# Patient Record
Sex: Male | Born: 1965 | Race: White | Hispanic: No | Marital: Married | Smoking: Light tobacco smoker
Health system: Southern US, Community
[De-identification: ages and names within clinical notes are randomized; demographics above are authoritative.]

## PROBLEM LIST (undated history)

## (undated) DIAGNOSIS — K8020 Calculus of gallbladder without cholecystitis without obstruction: Secondary | ICD-10-CM

## (undated) DIAGNOSIS — K219 Gastro-esophageal reflux disease without esophagitis: Secondary | ICD-10-CM

## (undated) DIAGNOSIS — Z9189 Other specified personal risk factors, not elsewhere classified: Secondary | ICD-10-CM

## (undated) DIAGNOSIS — A779 Spotted fever, unspecified: Secondary | ICD-10-CM

## (undated) DIAGNOSIS — K269 Duodenal ulcer, unspecified as acute or chronic, without hemorrhage or perforation: Secondary | ICD-10-CM

## (undated) HISTORY — DX: Spotted fever, unspecified: A77.9

## (undated) HISTORY — DX: Gastro-esophageal reflux disease without esophagitis: K21.9

## (undated) HISTORY — PX: NO PAST SURGERIES: SHX2092

## (undated) HISTORY — DX: Duodenal ulcer, unspecified as acute or chronic, without hemorrhage or perforation: K26.9

## (undated) HISTORY — DX: Calculus of gallbladder without cholecystitis without obstruction: K80.20

## (undated) HISTORY — DX: Other specified personal risk factors, not elsewhere classified: Z91.89

---

## 2009-06-18 ENCOUNTER — Ambulatory Visit: Payer: Self-pay | Admitting: Internal Medicine

## 2009-07-01 ENCOUNTER — Ambulatory Visit: Payer: Self-pay | Admitting: Internal Medicine

## 2009-07-06 LAB — CONVERTED CEMR LAB
BUN: 7 mg/dL (ref 6–23)
Basophils Relative: 0.5 % (ref 0.0–3.0)
CO2: 31 meq/L (ref 19–32)
Calcium: 9.1 mg/dL (ref 8.4–10.5)
Cholesterol: 210 mg/dL — ABNORMAL HIGH (ref 0–200)
Creatinine, Ser: 1 mg/dL (ref 0.4–1.5)
Eosinophils Relative: 3.4 % (ref 0.0–5.0)
GFR calc non Af Amer: 87.19 mL/min (ref >60)
Glucose, Bld: 85 mg/dL (ref 70–99)
HCT: 45.2 % (ref 39.0–52.0)
Hemoglobin: 15.7 g/dL (ref 13.0–17.0)
Lymphs Abs: 1.6 10*3/uL (ref 0.7–4.0)
MCV: 92.7 fL (ref 78.0–100.0)
Monocytes Absolute: 0.5 10*3/uL (ref 0.1–1.0)
Monocytes Relative: 8.7 % (ref 3.0–12.0)
Neutro Abs: 3.4 10*3/uL (ref 1.4–7.7)
Platelets: 228 10*3/uL (ref 150.0–400.0)
RBC: 4.87 M/uL (ref 4.22–5.81)
VLDL: 23.6 mg/dL (ref 0.0–40.0)
WBC: 5.7 10*3/uL (ref 4.5–10.5)

## 2011-04-25 ENCOUNTER — Encounter: Payer: Self-pay | Admitting: Internal Medicine

## 2011-04-25 ENCOUNTER — Ambulatory Visit (INDEPENDENT_AMBULATORY_CARE_PROVIDER_SITE_OTHER): Payer: Private Health Insurance - Indemnity | Admitting: Internal Medicine

## 2011-04-25 VITALS — BP 108/68 | HR 73 | Temp 98.2°F | Resp 14 | Wt 204.4 lb

## 2011-04-25 DIAGNOSIS — M779 Enthesopathy, unspecified: Secondary | ICD-10-CM

## 2011-04-25 DIAGNOSIS — M778 Other enthesopathies, not elsewhere classified: Secondary | ICD-10-CM

## 2011-04-25 MED ORDER — PREDNISONE 10 MG PO TABS: ORAL_TABLET | ORAL | Status: AC

## 2011-04-25 NOTE — Progress Notes (Signed)
  Subjective:    Patient ID: Todd Roman, male    DOB: 1965/08/27, 45 y.o.   MRN: [id number]  HPI  4 weeks history of pain in the right elbow, slightly swollen, does not recall any injury per se but he does play golf very frequently. Simple activities such as shaking hands or lifting causes pain and the patient is avoiding use his arm.  Past Medical History  Diagnosis Date  . No active medical problems    Past Surgical History  Procedure Date  . No past surgeries      Review of Systems No fever No redness or warmness in the area No history of previous injury there     Objective:   Physical Exam  Constitutional: He is oriented to person, place, and time. He appears well-developed and well-nourished. No distress.  Musculoskeletal:       Left elbow normal. Right elbow: Slightly tender at the external aspect of the joint, very mild swelling without fluctuance, no red or hot. Range of motion is normal, pain is reproducible with certain movements of the elbow.  Neurological: He is alert and oriented to person, place, and time.  Skin: He is not diaphoretic.  Psychiatric: He has a normal mood and affect. His behavior is normal. Judgment and thought content normal.       Assessment & Plan:

## 2011-04-25 NOTE — Assessment & Plan Note (Signed)
See instructions, if no better will call for referral

## 2011-04-25 NOTE — Patient Instructions (Signed)
Avoid elbow use x 2 weeks Ice at night x 2 weeks Arm brace Prednisone as prescribed Motrin 200 mg 2 tabs every 6 hours as needed, watch for stomach side effects, nausea , burning  Call if not improving in 2 - 3 weeks

## 2013-09-28 DIAGNOSIS — A779 Spotted fever, unspecified: Secondary | ICD-10-CM

## 2013-09-28 HISTORY — DX: Spotted fever, unspecified: A77.9

## 2014-05-13 ENCOUNTER — Ambulatory Visit (INDEPENDENT_AMBULATORY_CARE_PROVIDER_SITE_OTHER): Payer: BC Managed Care – PPO | Admitting: Internal Medicine

## 2014-05-13 ENCOUNTER — Telehealth: Payer: Self-pay | Admitting: Internal Medicine

## 2014-05-13 ENCOUNTER — Encounter: Payer: Self-pay | Admitting: Internal Medicine

## 2014-05-13 ENCOUNTER — Ambulatory Visit (HOSPITAL_BASED_OUTPATIENT_CLINIC_OR_DEPARTMENT_OTHER)
Admission: RE | Admit: 2014-05-13 | Discharge: 2014-05-13 | Disposition: A | Discharge status: Home / Self Care | Payer: BC Managed Care – PPO | Source: Ambulatory Visit | Attending: Internal Medicine | Admitting: Internal Medicine

## 2014-05-13 VITALS — BP 174/102 | HR 85 | Temp 99.2°F | Wt 225.2 lb

## 2014-05-13 DIAGNOSIS — R030 Elevated blood-pressure reading, without diagnosis of hypertension: Secondary | ICD-10-CM

## 2014-05-13 DIAGNOSIS — K219 Gastro-esophageal reflux disease without esophagitis: Secondary | ICD-10-CM | POA: Insufficient documentation

## 2014-05-13 DIAGNOSIS — R079 Chest pain, unspecified: Secondary | ICD-10-CM | POA: Diagnosis present

## 2014-05-13 DIAGNOSIS — R5383 Other fatigue: Secondary | ICD-10-CM

## 2014-05-13 DIAGNOSIS — R0789 Other chest pain: Secondary | ICD-10-CM

## 2014-05-13 DIAGNOSIS — K2100 Gastro-esophageal reflux disease with esophagitis, without bleeding: Secondary | ICD-10-CM

## 2014-05-13 DIAGNOSIS — R42 Dizziness and giddiness: Secondary | ICD-10-CM

## 2014-05-13 DIAGNOSIS — IMO0001 Reserved for inherently not codable concepts without codable children: Secondary | ICD-10-CM

## 2014-05-13 MED ORDER — RANITIDINE HCL 150 MG PO TABS: 150.0000 mg | ORAL_TABLET | ORAL | Status: DC | PRN

## 2014-05-13 MED ORDER — OMEPRAZOLE 40 MG PO CPDR: 40.0000 mg | DELAYED_RELEASE_CAPSULE | Freq: Every day | ORAL | Status: DC

## 2014-05-13 NOTE — Patient Instructions (Signed)
Get your blood work before you leave   Stop by the first floor and get the XR   Omeprazole 20 mg one before breakfast every day  Check the  blood pressure daily   Be sure your blood pressure is between  145/85 and 110/65.  if it is consistently higher or lower, let me know  Please come back to the office in 10 days  for a routine check up    Call anytime if he has any chest pain, difficulty breathing, severe dizziness, headache  Moderate alcohol

## 2014-05-13 NOTE — Progress Notes (Signed)
Pre visit review using our clinic review tool, if applicable. No additional management support is needed unless otherwise documented below in the visit note. 

## 2014-05-13 NOTE — Progress Notes (Signed)
Subjective:    Patient ID: Todd Roman, male    DOB: [birth year], 48 y.o.   MRN: [id number]  DOS:  05/13/2014 Type of visit - description : acute visit, new pt, several sx  Interval history: Started feeling unwell 5 days ago, lightheaded, dizzy, sweaty, very tired, "like the flu", "out of it" But denies actual confusion or loss of consciousness. He rested all weekend, felt better this week but he still is feeling quite tired and slightly dizzy.  Also, he has a long history of GERD symptoms, has occasional dysphagia and odynophagia, 2 days ago developed  upper abdominal pain and a sharp left-sided chest pain; he took Zantac and the abd pain is now gone however he continue with the chest pain. CP described as sharp feeling , worse with breathing or  talking. Not necessarily worse with eating. Does not radiate to his back.  BP is noted to be elevated, he admits that is feeling slightly flushed, denies any major problems with anxiety or depression, stress level is at baseline. No recent ambulatory BPs  ROS Denies fever or chills No difficulty breathing or extremity edema No nausea, vomiting, diarrhea or blood in the stools. No pain or swelling in the legs or calf. No recent airplane trips or prolonged car trips.  She has on and off cough, mostly dry,No sputum production or wheezing. Mild headache, no rash  Past Medical History  Diagnosis Date  . No active medical problems     Past Surgical History  Procedure Laterality Date  . No past surgeries      History   Social History  . Marital Status: Single    Spouse Name: N/A    Number of Children: N/A  . Years of Education: N/A   Occupational History  . Not on file.   Social History Main Topics  . Smoking status: Never Smoker   . Smokeless tobacco: Not on file  . Alcohol Use: Not on file  . Drug Use: Not on file  . Sexual Activity: Not on file   Other Topics Concern  . Not on file   Social History Narrative  . No  narrative on file     Family History  Problem Relation Age of Onset  . CAD Neg Hx   . Stroke Mother     M and Salvadore DomGF  . Colon cancer Neg Hx   . Prostate cancer Neg Hx        Medication List       This list is accurate as of: 05/13/14  1:23 PM.  Always use your most recent med list.               ranitidine 150 MG tablet  Commonly known as:  ZANTAC  Take 150 mg by mouth as needed for heartburn.           Objective:   Physical Exam  Skin:      BP 174/102  Pulse 85  Temp(Src) 99.2 F (37.3 C) (Oral)  Wt 225 lb 4 oz (102.173 kg)  SpO2 95% General -- alert, well-developed, NAD.  Neck --no thyromegaly , normal carotid pulse  HEENT-- Not pale.  Lungs -- normal respiratory effort, no intercostal retractions, no accessory muscle use, and normal breath sounds.  Heart-- normal rate, regular rhythm, no murmur.  Abdomen-- Not distended, good bowel sounds,soft, non-tender. No rebound or rigidity. No mass,or bruit. Extremities-- no pretibial edema bilaterally ; normal femoral pulses  Neurologic--  alert & oriented  X3. Speech normal, gait appropriate for age, strength symmetric and appropriate for age. EOMI,  DTRs symmetric. Psych-- Cognition and judgment appear intact. Cooperative with normal attention span and concentration. No anxious or depressed appearing.        Assessment & Plan:   Percent with multiple symptoms: Fatigue Chest pain Abdominal pain, resolved Chronic GERD symptoms Dizziness Elevated BP, Initially 174/102, recheck 160/88  Is hard to put all the symptoms under a single diagnosis .He certainly hasC  chronic GERD, taking Zantac only.  Chest pain is atypical for CAD, EKG today shows sinus rhythm without acute changes. Differential diagnosis includes GERD related pain, much less likely an aortic dissection or PE (he has no risk factors for PE). BP is decreasing  Plan: BMP, CBC, TSH, d-dimer Chest x-ray Omeprazole 40 daily GI referral Return to  the office 10 days Monitor ambulatory BPs Call anytime if symptoms increase  Today , I spent more than 35   min with the patient: >50% of the time counseling regards my differential diagnoses, plan of care, answering multiple questions. Also spent significant amount of time understanding his somehow complicated symptoms.

## 2014-05-13 NOTE — Telephone Encounter (Signed)
Resent to [facility]

## 2014-05-13 NOTE — Telephone Encounter (Signed)
Please resend omeprazole and ranitidine to [facility] on [address]

## 2014-05-14 LAB — CBC WITH DIFFERENTIAL/PLATELET
BASOS ABS: 0 10*3/uL (ref 0.0–0.1)
Basophils Relative: 0.5 % (ref 0.0–3.0)
EOS ABS: 0.2 10*3/uL (ref 0.0–0.7)
Eosinophils Relative: 2.1 % (ref 0.0–5.0)
HCT: 47.6 % (ref 39.0–52.0)
HEMOGLOBIN: 16.3 g/dL (ref 13.0–17.0)
LYMPHS PCT: 23 % (ref 12.0–46.0)
Lymphs Abs: 1.7 10*3/uL (ref 0.7–4.0)
MCHC: 34.2 g/dL (ref 30.0–36.0)
MCV: 93.1 fl (ref 78.0–100.0)
Monocytes Absolute: 0.5 10*3/uL (ref 0.1–1.0)
Monocytes Relative: 6.7 % (ref 3.0–12.0)
NEUTROS ABS: 5 10*3/uL (ref 1.4–7.7)
Neutrophils Relative %: 67.7 % (ref 43.0–77.0)
Platelets: 258 10*3/uL (ref 150.0–400.0)
RBC: 5.11 Mil/uL (ref 4.22–5.81)
RDW: 12.6 % (ref 11.5–15.5)
WBC: 7.4 10*3/uL (ref 4.0–10.5)

## 2014-05-14 LAB — COMPREHENSIVE METABOLIC PANEL
ALBUMIN: 4 g/dL (ref 3.5–5.2)
ALT: 61 U/L — AB (ref 0–53)
AST: 39 U/L — AB (ref 0–37)
Alkaline Phosphatase: 77 U/L (ref 39–117)
BUN: 9 mg/dL (ref 6–23)
CALCIUM: 9.5 mg/dL (ref 8.4–10.5)
CHLORIDE: 104 meq/L (ref 96–112)
CO2: 23 mEq/L (ref 19–32)
CREATININE: 1.2 mg/dL (ref 0.4–1.5)
GFR: 70.44 mL/min (ref >60.00)
Glucose, Bld: 89 mg/dL (ref 70–99)
POTASSIUM: 4 meq/L (ref 3.5–5.1)
SODIUM: 139 meq/L (ref 135–145)
Total Bilirubin: 0.7 mg/dL (ref 0.2–1.2)
Total Protein: 7.9 g/dL (ref 6.0–8.3)

## 2014-05-14 LAB — TSH: TSH: 2.02 u[IU]/mL (ref 0.35–4.50)

## 2014-05-14 LAB — D-DIMER, QUANTITATIVE (NOT AT ARMC): D-Dimer, Quant: 0.27 ug/mL-FEU (ref 0.00–0.48)

## 2014-05-15 ENCOUNTER — Encounter: Payer: Self-pay | Admitting: Internal Medicine

## 2014-05-22 ENCOUNTER — Ambulatory Visit: Payer: BC Managed Care – PPO | Admitting: Internal Medicine

## 2014-06-09 ENCOUNTER — Encounter: Payer: Self-pay | Admitting: Internal Medicine

## 2014-06-09 ENCOUNTER — Ambulatory Visit: Payer: BC Managed Care – PPO | Admitting: Internal Medicine

## 2014-10-20 ENCOUNTER — Ambulatory Visit (INDEPENDENT_AMBULATORY_CARE_PROVIDER_SITE_OTHER): Payer: BLUE CROSS/BLUE SHIELD | Admitting: Family Medicine

## 2014-10-20 ENCOUNTER — Ambulatory Visit (HOSPITAL_BASED_OUTPATIENT_CLINIC_OR_DEPARTMENT_OTHER)
Admission: RE | Admit: 2014-10-20 | Discharge: 2014-10-20 | Disposition: A | Discharge status: Home / Self Care | Payer: BLUE CROSS/BLUE SHIELD | Source: Ambulatory Visit | Attending: Family Medicine | Admitting: Family Medicine

## 2014-10-20 ENCOUNTER — Encounter: Payer: Self-pay | Admitting: Family Medicine

## 2014-10-20 VITALS — BP 140/92 | HR 83 | Temp 98.9°F | Wt 220.0 lb

## 2014-10-20 DIAGNOSIS — G8929 Other chronic pain: Secondary | ICD-10-CM

## 2014-10-20 DIAGNOSIS — R1010 Upper abdominal pain, unspecified: Secondary | ICD-10-CM

## 2014-10-20 DIAGNOSIS — R1011 Right upper quadrant pain: Secondary | ICD-10-CM

## 2014-10-20 DIAGNOSIS — F1020 Alcohol dependence, uncomplicated: Secondary | ICD-10-CM

## 2014-10-20 DIAGNOSIS — K760 Fatty (change of) liver, not elsewhere classified: Secondary | ICD-10-CM | POA: Diagnosis not present

## 2014-10-20 LAB — CBC WITH DIFFERENTIAL/PLATELET
BASOS PCT: 0.5 % (ref 0.0–3.0)
Basophils Absolute: 0 10*3/uL (ref 0.0–0.1)
EOS PCT: 4.6 % (ref 0.0–5.0)
Eosinophils Absolute: 0.3 10*3/uL (ref 0.0–0.7)
HCT: 47.1 % (ref 39.0–52.0)
Hemoglobin: 16.5 g/dL (ref 13.0–17.0)
Lymphocytes Relative: 27.4 % (ref 12.0–46.0)
Lymphs Abs: 1.5 10*3/uL (ref 0.7–4.0)
MCHC: 35.1 g/dL (ref 30.0–36.0)
MCV: 90.8 fl (ref 78.0–100.0)
MONO ABS: 0.5 10*3/uL (ref 0.1–1.0)
Monocytes Relative: 8.3 % (ref 3.0–12.0)
NEUTROS PCT: 59.2 % (ref 43.0–77.0)
Neutro Abs: 3.3 10*3/uL (ref 1.4–7.7)
PLATELETS: 229 10*3/uL (ref 150.0–400.0)
RBC: 5.19 Mil/uL (ref 4.22–5.81)
RDW: 12.7 % (ref 11.5–15.5)
WBC: 5.5 10*3/uL (ref 4.0–10.5)

## 2014-10-20 LAB — BASIC METABOLIC PANEL
BUN: 10 mg/dL (ref 6–23)
CHLORIDE: 103 meq/L (ref 96–112)
CO2: 27 mEq/L (ref 19–32)
Calcium: 9.5 mg/dL (ref 8.4–10.5)
Creatinine, Ser: 1.12 mg/dL (ref 0.40–1.50)
GFR: 74.67 mL/min (ref >60.00)
Glucose, Bld: 96 mg/dL (ref 70–99)
Potassium: 3.9 mEq/L (ref 3.5–5.1)
SODIUM: 136 meq/L (ref 135–145)

## 2014-10-20 LAB — HEPATIC FUNCTION PANEL
ALT: 61 U/L — AB (ref 0–53)
AST: 37 U/L (ref 0–37)
Albumin: 4.5 g/dL (ref 3.5–5.2)
Alkaline Phosphatase: 80 U/L (ref 39–117)
BILIRUBIN DIRECT: 0.1 mg/dL (ref 0.0–0.3)
BILIRUBIN TOTAL: 0.8 mg/dL (ref 0.2–1.2)
Total Protein: 7.4 g/dL (ref 6.0–8.3)

## 2014-10-20 LAB — TSH: TSH: 2.24 u[IU]/mL (ref 0.35–4.50)

## 2014-10-20 LAB — LIPASE: LIPASE: 42 U/L (ref 11.0–59.0)

## 2014-10-20 LAB — HEPATITIS PANEL, ACUTE
HCV Ab: NEGATIVE
HEP B C IGM: NONREACTIVE
Hep A IgM: NONREACTIVE
Hepatitis B Surface Ag: NEGATIVE

## 2014-10-20 LAB — AMYLASE: Amylase: 40 U/L (ref 27–131)

## 2014-10-20 LAB — GAMMA GT: GGT: 83 U/L — ABNORMAL HIGH (ref 7–51)

## 2014-10-20 MED ORDER — PROBIOTIC DAILY PO CAPS: ORAL_CAPSULE | ORAL | Status: DC

## 2014-10-20 MED ORDER — RANITIDINE HCL 150 MG PO TABS: ORAL_TABLET | ORAL | Status: DC

## 2014-10-20 NOTE — Progress Notes (Signed)
Subjective:    Patient ID: Todd Roman, male    DOB: 1965-09-27, 49 y.o.   MRN: [id number]  HPI  Patient here with complaint of heavy feeling RUQ in abd.  He admits to drinking a lot although he has cut down to 15 drinks a week.   bp has been flucuating 128/90 , 132/91.   + nausea after eating. No vomiting, no blood in stool, fatigue.    Past Medical History  Diagnosis Date  . GERD (gastroesophageal reflux disease)     Review of Systems  Constitutional: Negative.   HENT: Negative for congestion, ear pain, hearing loss, nosebleeds, postnasal drip, rhinorrhea, sinus pressure, sneezing and tinnitus.   Eyes: Negative for photophobia, discharge, itching and visual disturbance.  Respiratory: Negative.   Cardiovascular: Negative.   Gastrointestinal: Positive for abdominal pain. Negative for constipation, blood in stool, abdominal distention and anal bleeding.  Endocrine: Negative.   Genitourinary: Negative.   Musculoskeletal: Negative.   Skin: Negative.   Allergic/Immunologic: Negative.   Neurological: Negative for dizziness, weakness, light-headedness, numbness and headaches.  Psychiatric/Behavioral: Negative for suicidal ideas, confusion, sleep disturbance, dysphoric mood, decreased concentration and agitation. The patient is not nervous/anxious.     No current outpatient prescriptions on file prior to visit.   No current facility-administered medications on file prior to visit.       Objective:    Physical Exam  Constitutional: He appears well-developed and well-nourished. No distress.  Cardiovascular: Normal rate, regular rhythm and normal heart sounds.   Pulmonary/Chest: Effort normal and breath sounds normal. No respiratory distress.  Abdominal: Soft. There is tenderness.    Psychiatric: He has a normal mood and affect. His behavior is normal. Judgment and thought content normal.    BP 140/92 mmHg  Pulse 83  Temp(Src) 98.9 F (37.2 C) (Oral)  Wt 220 lb (99.791  kg)  SpO2 96% Wt Readings from Last 3 Encounters:  10/20/14 220 lb (99.791 kg)  05/13/14 225 lb 4 oz (102.173 kg)  04/25/11 204 lb 6 oz (92.704 kg)     Lab Results  Component Value Date   WBC 7.4 05/13/2014   HGB 16.3 05/13/2014   HCT 47.6 05/13/2014   PLT 258.0 05/13/2014   GLUCOSE 89 05/13/2014   CHOL 210* 07/01/2009   TRIG 118.0 07/01/2009   HDL 41.20 07/01/2009   LDLDIRECT 161.0 07/01/2009   ALT 61* 05/13/2014   AST 39* 05/13/2014   NA 139 05/13/2014   K 4.0 05/13/2014   CL 104 05/13/2014   CREATININE 1.2 05/13/2014   BUN 9 05/13/2014   CO2 23 05/13/2014   TSH 2.02 05/13/2014       Assessment & Plan:   Problem List Items Addressed This Visit    Alcohol dependence    Pt has already stopped drinking all alcohol       Abdominal pain, right upper quadrant    Check [place] and labs Suspect alcohol is cause Will f/u with pt when result come back       Other Visit Diagnoses    Abdominal pain, chronic, right upper quadrant    -  Primary    Relevant Orders    Basic metabolic panel    CBC with Differential/Platelet    Hepatic function panel    TSH    POCT urinalysis dipstick    Hepatitis, Acute    Gamma GT    Amylase    Lipase    US Abdomen Complete  I have discontinued Todd Roman's omeprazole. I have also changed his ranitidine. Additionally, I am having him start on PROBIOTIC DAILY.  Meds ordered this encounter  Medications  . ranitidine (ZANTAC) 150 MG tablet    Sig: 1 po qd prn    Dispense:  30 tablet    Refill:  3  . Probiotic Product (PROBIOTIC DAILY) CAPS    Sig: 1 po qd     Loreen FreudYvonne Lowne, DO

## 2014-10-20 NOTE — Patient Instructions (Signed)
Alcohol Use Disorder Alcohol use disorder is a mental disorder. It is not a one-time incident of heavy drinking. Alcohol use disorder is the excessive and uncontrollable use of alcohol over time that leads to problems with functioning in one or more areas of daily living. People with this disorder risk harming themselves and others when they drink to excess. Alcohol use disorder also can cause other mental disorders, such as mood and anxiety disorders, and serious physical problems. People with alcohol use disorder often misuse other drugs.  Alcohol use disorder is common and widespread. Some people with this disorder drink alcohol to cope with or escape from negative life events. Others drink to relieve chronic pain or symptoms of mental illness. People with a family history of alcohol use disorder are at higher risk of losing control and using alcohol to excess.  SYMPTOMS  Signs and symptoms of alcohol use disorder may include the following:   Consumption ofalcohol inlarger amounts or over a longer period of time than intended.  Multiple unsuccessful attempts to cutdown or control alcohol use.   A great deal of time spent obtaining alcohol, using alcohol, or recovering from the effects of alcohol (hangover).  A strong desire or urge to use alcohol (cravings).   Continued use of alcohol despite problems at work, school, or home because of alcohol use.   Continued use of alcohol despite problems in relationships because of alcohol use.  Continued use of alcohol in situations when it is physically hazardous, such as driving a car.  Continued use of alcohol despite awareness of a physical or psychological problem that is likely related to alcohol use. Physical problems related to alcohol use can involve the brain, heart, liver, stomach, and intestines. Psychological problems related to alcohol use include intoxication, depression, anxiety, psychosis, delirium, and dementia.   The need for  increased amounts of alcohol to achieve the same desired effect, or a decreased effect from the consumption of the same amount of alcohol (tolerance).  Withdrawal symptoms upon reducing or stopping alcohol use, or alcohol use to reduce or avoid withdrawal symptoms. Withdrawal symptoms include:  Racing heart.  Hand tremor.  Difficulty sleeping.  Nausea.  Vomiting.  Hallucinations.  Restlessness.  Seizures. DIAGNOSIS Alcohol use disorder is diagnosed through an assessment by your health care provider. Your health care provider may start by asking three or four questions to screen for excessive or problematic alcohol use. To confirm a diagnosis of alcohol use disorder, at least two symptoms must be present within a 12-month period. The severity of alcohol use disorder depends on the number of symptoms:  Mild--two or three.  Moderate--four or five.  Severe--six or more. Your health care provider may perform a physical exam or use results from lab tests to see if you have physical problems resulting from alcohol use. Your health care provider may refer you to a mental health professional for evaluation. TREATMENT  Some people with alcohol use disorder are able to reduce their alcohol use to low-risk levels. Some people with alcohol use disorder need to quit drinking alcohol. When necessary, mental health professionals with specialized training in substance use treatment can help. Your health care provider can help you decide how severe your alcohol use disorder is and what type of treatment you need. The following forms of treatment are available:   Detoxification. Detoxification involves the use of prescription medicines to prevent alcohol withdrawal symptoms in the first week after quitting. This is important for people with a history of symptoms   of withdrawal and for heavy drinkers who are likely to have withdrawal symptoms. Alcohol withdrawal can be dangerous and, in severe cases, cause  death. Detoxification is usually provided in a hospital or in-patient substance use treatment facility.  Counseling or talk therapy. Talk therapy is provided by substance use treatment counselors. It addresses the reasons people use alcohol and ways to keep them from drinking again. The goals of talk therapy are to help people with alcohol use disorder find healthy activities and ways to cope with life stress, to identify and avoid triggers for alcohol use, and to handle cravings, which can cause relapse.  Medicines.Different medicines can help treat alcohol use disorder through the following actions:  Decrease alcohol cravings.  Decrease the positive reward response felt from alcohol use.  Produce an uncomfortable physical reaction when alcohol is used (aversion therapy).  Support groups. Support groups are run by people who have quit drinking. They provide emotional support, advice, and guidance. These forms of treatment are often combined. Some people with alcohol use disorder benefit from intensive combination treatment provided by specialized substance use treatment centers. Both inpatient and outpatient treatment programs are available. Document Released: 08/24/2004 Document Revised: 12/01/2013 Document Reviewed: 10/24/2012 Exit[employer] Patient Information 2015 [employer]. This information is not intended to replace advice given to you by your health care provider. Make sure you discuss any questions you have with your health care provider.  

## 2014-10-20 NOTE — Assessment & Plan Note (Signed)
Check US and labs Suspect alcohol is cause Will f/u with pt when result come back

## 2014-10-20 NOTE — Progress Notes (Signed)
Pre visit review using our clinic review tool, if applicable. No additional management support is needed unless otherwise documented below in the visit note. 

## 2014-10-20 NOTE — Assessment & Plan Note (Signed)
Pt has already stopped drinking all alcohol

## 2014-10-21 ENCOUNTER — Encounter: Payer: Self-pay | Admitting: Family Medicine

## 2014-10-22 ENCOUNTER — Other Ambulatory Visit: Payer: Self-pay | Admitting: Family Medicine

## 2014-10-22 ENCOUNTER — Telehealth: Payer: Self-pay | Admitting: Internal Medicine

## 2014-10-22 DIAGNOSIS — R1011 Right upper quadrant pain: Secondary | ICD-10-CM

## 2014-10-22 DIAGNOSIS — K828 Other specified diseases of gallbladder: Secondary | ICD-10-CM

## 2014-10-22 NOTE — Telephone Encounter (Signed)
He feels awful.  He is concerned about a trip he is planning

## 2014-10-22 NOTE — Telephone Encounter (Signed)
FYI

## 2014-10-22 NOTE — Telephone Encounter (Signed)
Caller name: Clemencia CourseDockery, Mehar CRelation to pt: Call back number: [phone]   Reason for call:  Pt called back to speak with MD regarding he's current physical condition. Pt states he is planning a trip to [place] and the date the general surgeon offered conflicts and would like MD clinical advice. Please advise.

## 2014-10-22 NOTE — Telephone Encounter (Signed)
Please call patient-- see phone note

## 2014-10-24 NOTE — Telephone Encounter (Signed)
rec to see surgeon first , before trip

## 2014-10-26 NOTE — Telephone Encounter (Signed)
LMOM informing Pt to return call.  

## 2014-10-30 DIAGNOSIS — K8020 Calculus of gallbladder without cholecystitis without obstruction: Secondary | ICD-10-CM

## 2014-10-30 HISTORY — DX: Calculus of gallbladder without cholecystitis without obstruction: K80.20

## 2015-01-14 ENCOUNTER — Ambulatory Visit (INDEPENDENT_AMBULATORY_CARE_PROVIDER_SITE_OTHER): Payer: BLUE CROSS/BLUE SHIELD | Admitting: Medical

## 2015-01-14 ENCOUNTER — Encounter: Payer: Self-pay | Admitting: Medical

## 2015-01-14 VITALS — BP 152/89 | HR 76 | Temp 98.2°F | Ht 71.0 in | Wt 219.0 lb

## 2015-01-14 DIAGNOSIS — J020 Streptococcal pharyngitis: Secondary | ICD-10-CM

## 2015-01-14 DIAGNOSIS — J029 Acute pharyngitis, unspecified: Secondary | ICD-10-CM

## 2015-01-14 DIAGNOSIS — R1011 Right upper quadrant pain: Secondary | ICD-10-CM

## 2015-01-14 LAB — POCT RAPID STREP A (OFFICE): RAPID STREP A SCREEN: POSITIVE — AB

## 2015-01-14 MED ORDER — CEFDINIR 300 MG PO CAPS: 300.0000 mg | ORAL_CAPSULE | Freq: Two times a day (BID) | ORAL | Status: DC

## 2015-01-14 NOTE — Assessment & Plan Note (Signed)
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 Advised pt to get cbc, cmp, lipase,amylase tomorrow stat.   Pt needs to get back to work.  Will order US abdomen for Saturday.  If symptoms worsen or change after hours then ED eval.

## 2015-01-14 NOTE — Progress Notes (Signed)
b

## 2015-01-14 NOTE — Assessment & Plan Note (Addendum)
   Your strep test was positive. I am prescribing cefdnir  antibiotic. Rest hydrate, tylenol for fever and warm salt water gargles. Follow up in 7 days or as needed.

## 2015-01-14 NOTE — Progress Notes (Signed)
Subjective:    Patient ID: Todd Roman, male    DOB: Aug 22, 1965, 49 y.o.   MRN: [id number]  HPI   Pt in with st for 7-8 days. Hurts to swallow. Pain has been very mild at onset but then progressively more painful. Some sweats last night. Some body aches all over. Pt strep positive today.  Pt also has known gallbadder disease he has some sludge known after [place]. Pt has tried conservative measures in past. Only some mild dull pain ruq this am and has mild dull sensation to rt upper quadrant even now. But no vomiting or nausea.   Review of Systems  Constitutional: Positive for chills and diaphoresis. Negative for fever and fatigue.       Last night with faint chills.  HENT: Positive for sore throat. Negative for congestion, ear pain, mouth sores, nosebleeds and sinus pressure.   Respiratory: Negative for cough, chest tightness, shortness of breath and wheezing.   Cardiovascular: Negative for chest pain and palpitations.  Gastrointestinal: Positive for abdominal pain. Negative for nausea, vomiting, constipation, blood in stool and abdominal distention.       Dull rt upper quadrant pain.  Musculoskeletal:       Some mild diffuse aching to muscles.  Neurological: Negative for syncope, facial asymmetry and light-headedness.  Hematological: Positive for adenopathy.       Faint submandibular nodes mild swollen.  Psychiatric/Behavioral: Negative for behavioral problems and confusion.   Past Medical History  Diagnosis Date  . GERD (gastroesophageal reflux disease)   . Symptomatic cholelithiasis 10/2014    Dr. Derrell Lolling    History   Social History  . Marital Status: Married    Spouse Name: N/A  . Number of Children: 1  . Years of Education: N/A   Occupational History  . car sales -- [employer]    Social History Main Topics  . Smoking status: Never Smoker   . Smokeless tobacco: Never Used  . Alcohol Use: 9.0 oz/week    15 Cans of beer per week     Comment: 20-25 "servings" of  alcohol a week  . Drug Use: No  . Sexual Activity: Not on file   Other Topics Concern  . Not on file   Social History Narrative   Household- pt , wife, son    Past Surgical History  Procedure Laterality Date  . No past surgeries      Family History  Problem Relation Age of Onset  . CAD Neg Hx   . Colon cancer Neg Hx   . Prostate cancer Neg Hx   . Stroke Mother     M and Salvadore Dom  . Alcohol abuse Mother   . Alcohol abuse Maternal Aunt     No Known Allergies  Current Outpatient Prescriptions on File Prior to Visit  Medication Sig Dispense Refill  . Probiotic Product (PROBIOTIC DAILY) CAPS 1 po qd    . ranitidine (ZANTAC) 150 MG tablet 1 po qd prn 30 tablet 3   No current facility-administered medications on file prior to visit.    BP 152/89 mmHg  Pulse 76  Temp(Src) 98.2 F (36.8 C) (Oral)  Ht  (1.803 m)  Wt 219 lb (99.338 kg)  BMI 30.56 kg/m2  SpO2 98%       Objective:   Physical Exam  General  Mental Status - Alert. General Appearance - Well groomed. Not in acute distress.  Skin Rashes- No Rashes.  HEENT Head- Normal. Ear Auditory Canal - Left-  Normal. Right - Normal.Tympanic Membrane- Left- Normal. Right- Normal. Eye Sclera/Conjunctiva- Left- Normal. Right- Normal. Nose & Sinuses Nasal Mucosa- Left-  Not boggy or Congested. Right-  Not  boggy or Congested. Mouth & Throat Lips: Upper Lip- Normal: no dryness, cracking, pallor, cyanosis, or vesicular eruption. Lower Lip-Normal: no dryness, cracking, pallor, cyanosis or vesicular eruption. Buccal Mucosa- Bilateral- No Aphthous ulcers. Oropharynx- No Discharge or Erythema. Tonsils: Characteristics- Bilateral- Erythema + Congestion. Size/Enlargement- Bilateral- 1+  enlargement. Discharge- bilateral-None.  Neck Neck- Supple. No Masses. Faint enlarged sumbandibular nodes.   Chest and Lung Exam Auscultation: Breath Sounds:- even and unlabored  Cardiovascular Auscultation:Rythm- Regular, rate and  rhythm. Murmurs & Other Heart Sounds:Ausculatation of the heart reveal- No Murmurs.  Lymphatic Head & Neck General Head & Neck Lymphatics: Bilateral: Description- No Localized lymphadenopathy.    Abdomen Inspection:-Inspection Normal.  Palpation/Perucssion: Palpation and Percussion of the abdomen reveal- faint mild ruq Tender, No Rebound tenderness, No rigidity(Guarding) and No Palpable abdominal masses.  Liver:-Normal.  Spleen:- Normal.   Back- no cva tenderness.        Assessment & Plan:

## 2015-01-14 NOTE — Progress Notes (Signed)
Pre visit review using our clinic review tool, if applicable. No additional management support is needed unless otherwise documented below in the visit note. 

## 2015-01-14 NOTE — Patient Instructions (Signed)
Acute pharyngitis   Your strep test was positive. I am prescribing cefdnir  antibiotic. Rest hydrate, tylenol for fever and warm salt water gargles. Follow up in 7 days or as needed.  Pain in the abdomen Known sludge in gallbladder. Advised pt to get cbc, cmp, lipase,amylase tomorrow stat.   Pt needs to get back to work.  Will order US abdomen for Saturday.  If symptoms worsen or change after hours then ED eval.   Follow up 7 days or as needed

## 2015-01-15 ENCOUNTER — Other Ambulatory Visit (INDEPENDENT_AMBULATORY_CARE_PROVIDER_SITE_OTHER): Payer: BLUE CROSS/BLUE SHIELD

## 2015-01-15 DIAGNOSIS — R1011 Right upper quadrant pain: Secondary | ICD-10-CM

## 2015-01-15 DIAGNOSIS — J020 Streptococcal pharyngitis: Secondary | ICD-10-CM | POA: Diagnosis not present

## 2015-01-15 LAB — CBC WITH DIFFERENTIAL/PLATELET
BASOS ABS: 0 10*3/uL (ref 0.0–0.1)
Basophils Relative: 0.5 % (ref 0.0–3.0)
Eosinophils Absolute: 0.2 10*3/uL (ref 0.0–0.7)
Eosinophils Relative: 4 % (ref 0.0–5.0)
HEMATOCRIT: 46.1 % (ref 39.0–52.0)
Hemoglobin: 15.8 g/dL (ref 13.0–17.0)
LYMPHS ABS: 1.7 10*3/uL (ref 0.7–4.0)
Lymphocytes Relative: 33.1 % (ref 12.0–46.0)
MCHC: 34.2 g/dL (ref 30.0–36.0)
MCV: 92.2 fl (ref 78.0–100.0)
Monocytes Absolute: 0.3 10*3/uL (ref 0.1–1.0)
Monocytes Relative: 6.7 % (ref 3.0–12.0)
NEUTROS ABS: 2.8 10*3/uL (ref 1.4–7.7)
Neutrophils Relative %: 55.7 % (ref 43.0–77.0)
PLATELETS: 235 10*3/uL (ref 150.0–400.0)
RBC: 5.01 Mil/uL (ref 4.22–5.81)
RDW: 12.6 % (ref 11.5–15.5)
WBC: 5 10*3/uL (ref 4.0–10.5)

## 2015-01-15 LAB — COMPREHENSIVE METABOLIC PANEL
ALBUMIN: 4.2 g/dL (ref 3.5–5.2)
ALT: 49 U/L (ref 0–53)
AST: 32 U/L (ref 0–37)
Alkaline Phosphatase: 78 U/L (ref 39–117)
BUN: 8 mg/dL (ref 6–23)
CALCIUM: 9.1 mg/dL (ref 8.4–10.5)
CHLORIDE: 104 meq/L (ref 96–112)
CO2: 28 mEq/L (ref 19–32)
CREATININE: 1.13 mg/dL (ref 0.40–1.50)
GFR: 73.83 mL/min (ref >60.00)
Glucose, Bld: 114 mg/dL — ABNORMAL HIGH (ref 70–99)
POTASSIUM: 3.6 meq/L (ref 3.5–5.1)
Sodium: 137 mEq/L (ref 135–145)
Total Bilirubin: 0.6 mg/dL (ref 0.2–1.2)
Total Protein: 6.7 g/dL (ref 6.0–8.3)

## 2015-01-15 LAB — AMYLASE: Amylase: 50 U/L (ref 27–131)

## 2015-01-15 LAB — LIPASE: Lipase: 78 U/L — ABNORMAL HIGH (ref 11.0–59.0)

## 2015-01-15 MED ORDER — CEFTRIAXONE SODIUM 1 G IJ SOLR
1.0000 g | Freq: Once | INTRAMUSCULAR | Status: AC
  Administered 2015-01-15: 1 g via INTRAMUSCULAR

## 2015-01-15 NOTE — Addendum Note (Signed)
Addended by: Lurline Hare on: 01/15/2015 08:09 AM   Modules accepted: Orders

## 2015-01-16 ENCOUNTER — Ambulatory Visit (HOSPITAL_BASED_OUTPATIENT_CLINIC_OR_DEPARTMENT_OTHER)
Admission: RE | Admit: 2015-01-16 | Discharge: 2015-01-16 | Disposition: A | Discharge status: Home / Self Care | Payer: BLUE CROSS/BLUE SHIELD | Source: Ambulatory Visit | Attending: Medical | Admitting: Medical

## 2015-01-16 DIAGNOSIS — R1011 Right upper quadrant pain: Secondary | ICD-10-CM

## 2015-01-17 ENCOUNTER — Telehealth: Payer: Self-pay | Admitting: Medical

## 2015-01-17 DIAGNOSIS — R748 Abnormal levels of other serum enzymes: Secondary | ICD-10-CM

## 2015-01-19 NOTE — Telephone Encounter (Signed)
Called patient regarding orders being placed for labs. Patient agreed to come in.

## 2015-01-21 ENCOUNTER — Ambulatory Visit (INDEPENDENT_AMBULATORY_CARE_PROVIDER_SITE_OTHER): Payer: BLUE CROSS/BLUE SHIELD | Admitting: Medical

## 2015-01-21 ENCOUNTER — Encounter: Payer: Self-pay | Admitting: Medical

## 2015-01-21 VITALS — BP 129/83 | HR 77 | Temp 98.1°F | Ht 71.0 in | Wt 220.6 lb

## 2015-01-21 DIAGNOSIS — J029 Acute pharyngitis, unspecified: Secondary | ICD-10-CM

## 2015-01-21 DIAGNOSIS — R748 Abnormal levels of other serum enzymes: Secondary | ICD-10-CM | POA: Diagnosis not present

## 2015-01-21 LAB — POCT RAPID STREP A (OFFICE): Rapid Strep A Screen: NEGATIVE

## 2015-01-21 MED ORDER — AZITHROMYCIN 250 MG PO TABS: ORAL_TABLET | ORAL | Status: DC

## 2015-01-21 NOTE — Progress Notes (Signed)
Subjective:    Patient ID: Todd Roman, male    DOB: 1966-04-21, 49 y.o.   MRN: [id number]  HPI   Pt in with felt good/as if symptoms were resolving after rocpehin  injection and cefdnir  Antibiotic(within 3-4 days). Pt had strep/ rapid test + treated with the antibioics. But over last three days he is getting sore throat  Sensation again and burning. States neck feels slight sore and very faint ha. But no stiff neck. No nausea, no vomiting and no gross motor or sensory function deficits. No raw gum/beefy red buccal mucosa. No white discharge in mouth. No fever, no chills, no sweats and no boy aches.  At that time pt also had ruq faint pain. I gave rocephin in event his gallbladder was infamed since he had hx of gb disease. But his pain is improved. Pt states eating better now.    Mild cough sneezing. He did some yard work on on Monday night.   Review of Systems  Constitutional: Negative for fever, chills and fatigue.  HENT: Positive for sneezing and sore throat. Negative for congestion, facial swelling, mouth sores, nosebleeds, rhinorrhea, tinnitus, trouble swallowing and voice change.        Faint sore throat.  Respiratory: Positive for cough. Negative for chest tightness, shortness of breath and wheezing.   Cardiovascular: Negative for chest pain and palpitations.  Gastrointestinal: Positive for abdominal pain. Negative for nausea, vomiting and blood in stool.       Faint residual rt upper quadrant pain at times. But better per pt.  Musculoskeletal: Negative for myalgias, back pain, neck pain and neck stiffness.       Faint sore neck.  Skin: Negative for rash.  Neurological: Positive for headaches. Negative for dizziness, facial asymmetry, speech difficulty, weakness, light-headedness and numbness.       Very faint low level ha  Hematological: Negative for adenopathy. Does not bruise/bleed easily.  Psychiatric/Behavioral: Negative for behavioral problems and confusion.     Past Medical History  Diagnosis Date  . GERD (gastroesophageal reflux disease)   . Symptomatic cholelithiasis 10/2014    Dr. Derrell Lolling    History   Social History  . Marital Status: Married    Spouse Name: N/A  . Number of Children: 1  . Years of Education: N/A   Occupational History  . car sales -- [employer]    Social History Main Topics  . Smoking status: Never Smoker   . Smokeless tobacco: Never Used  . Alcohol Use: 9.0 oz/week    15 Cans of beer per week     Comment: 20-25 "servings" of alcohol a week  . Drug Use: No  . Sexual Activity: Not on file   Other Topics Concern  . Not on file   Social History Narrative   Household- pt , wife, son    Past Surgical History  Procedure Laterality Date  . No past surgeries      Family History  Problem Relation Age of Onset  . CAD Neg Hx   . Colon cancer Neg Hx   . Prostate cancer Neg Hx   . Stroke Mother     M and Salvadore Dom  . Alcohol abuse Mother   . Alcohol abuse Maternal Aunt     No Known Allergies  Current Outpatient Prescriptions on File Prior to Visit  Medication Sig Dispense Refill  . aspirin 162 MG EC tablet Take 162 mg by mouth daily.    . cefdinir (OMNICEF) 300 MG capsule  Take 1 capsule (300 mg total) by mouth 2 (two) times daily. 20 capsule 0  . Probiotic Product (PROBIOTIC DAILY) CAPS 1 po qd    . ranitidine (ZANTAC) 150 MG tablet 1 po qd prn 30 tablet 3   No current facility-administered medications on file prior to visit.    BP 129/83 mmHg  Pulse 77  Temp(Src) 98.1 F (36.7 C) (Oral)  Ht  (1.803 m)  Wt 220 lb 9.6 oz (100.064 kg)  BMI 30.78 kg/m2  SpO2 99%       Objective:   Physical Exam  General  Mental Status - Alert. General Appearance - Well groomed. Not in acute distress.  Skin Rashes- No Rashes.  HEENT Head- Normal. Ear Auditory Canal - Left- Normal. Right - Normal.Tympanic Membrane- Left- Normal. Right- Normal. Eye Sclera/Conjunctiva- Left- Normal. Right-  Normal. Nose & Sinuses Nasal Mucosa- Left-  Not boggy or Congested. Right-  Not  boggy or Congested. Mouth & Throat Lips: Upper Lip- Normal: no dryness, cracking, pallor, cyanosis, or vesicular eruption. Lower Lip-Normal: no dryness, cracking, pallor, cyanosis or vesicular eruption. Buccal Mucosa- Bilateral- No Aphthous ulcers. Oropharynx- No Discharge or Erythema. Tonsils: Characteristics- Bilateral- faint  Erythema . Size/Enlargement- Bilateral- No enlargement. Discharge- bilateral-None.   Chest and Lung Exam Auscultation: Breath Sounds:- even and unlabored, but bilateral upper lobe rhonchi.  Cardiovascular Auscultation:Rythm- Regular, rate and rhythm. Murmurs & Other Heart Sounds:Ausculatation of the heart reveal- No Murmurs.  Lymphatic Head & Neck General Head & Neck Lymphatics: Bilateral: Description- No Localized lymphadenopathy.  .  Abdomen Inspection:-Inspection Normal.  Palpation/Perucssion: Palpation and Percussion of the abdomen reveal- Non Tender, No Rebound tenderness, No rigidity(Guarding) and No Palpable abdominal masses.  Liver:-Normal.  Spleen:- Normal.    Neurologic Cranial Nerve exam:- CN III-XII intact(No nystagmus), symmetric smile. Strength:- 5/5 equal and symmetric strength both upper and lower extremities.        Assessment & Plan:  Persisting sorethroat.- possible tail end of prior strep infection. Though with recent antibiotic that we gave in office this would be unusual. Presently stop cefdnir and will rx azithromycin.   If any stiff neck with ha then advise ED for evaluation of meningitis.  Healthy diet and repeat amylase and lipase on Monday. When in please update [place] how your throat is.  Also recommended using claritin otc to stop pnd. Since you mention some mild allergy symptoms. PND sometimes causes mild throat irritation.  Follow up Monday or as needed

## 2015-01-21 NOTE — Progress Notes (Signed)
Pre visit review using our clinic review tool, if applicable. No additional management support is needed unless otherwise documented below in the visit note. 

## 2015-01-21 NOTE — Patient Instructions (Signed)
Persisting sorethroat.- possible tail end of prior strep infection. Though with recent antibiotic that we gave in office this would be unusual. Presently stop cefdnir and will rx azithromycin.   If any stiff neck with ha then advise ED for evaluation of meningitis.  Healthy diet and repeat amylase and lipase on Monday. When in please update [place] how your throat is.  Also recommended using claritin otc to stop pnd. Since you mention some mild allergy symptoms. PND sometimes causes mild throat irritation.  Follow up Monday or as needed

## 2015-01-25 ENCOUNTER — Encounter: Payer: Self-pay | Admitting: Medical

## 2015-01-25 ENCOUNTER — Other Ambulatory Visit (INDEPENDENT_AMBULATORY_CARE_PROVIDER_SITE_OTHER): Payer: BLUE CROSS/BLUE SHIELD

## 2015-01-25 DIAGNOSIS — K859 Acute pancreatitis, unspecified: Secondary | ICD-10-CM

## 2015-01-25 DIAGNOSIS — R1011 Right upper quadrant pain: Secondary | ICD-10-CM

## 2015-01-25 DIAGNOSIS — R748 Abnormal levels of other serum enzymes: Secondary | ICD-10-CM | POA: Diagnosis not present

## 2015-01-25 LAB — AMYLASE: AMYLASE: 45 U/L (ref 27–131)

## 2015-01-25 LAB — LIPASE: Lipase: 61 U/L — ABNORMAL HIGH (ref 11.0–59.0)

## 2015-01-26 ENCOUNTER — Encounter: Payer: Self-pay | Admitting: Medical

## 2015-01-26 ENCOUNTER — Ambulatory Visit: Payer: BLUE CROSS/BLUE SHIELD | Admitting: Family Medicine

## 2015-01-26 NOTE — Telephone Encounter (Signed)
Mild elevated pancrease enzymes. Ruq pain faint. Hx alchohol dependence per chart. Pt states no longer drinking alcohol. Will refer to GI.

## 2015-01-27 ENCOUNTER — Telehealth: Payer: Self-pay | Admitting: Family Medicine

## 2015-01-27 NOTE — Telephone Encounter (Signed)
Pt was no show 01/26/15 6:15pm, patient left message on VM on 6/28 2:43pm stating that he was feeling better and to cancel this appt, charge?

## 2015-01-28 NOTE — Telephone Encounter (Signed)
No charge. 

## 2015-02-04 ENCOUNTER — Ambulatory Visit (INDEPENDENT_AMBULATORY_CARE_PROVIDER_SITE_OTHER): Payer: BLUE CROSS/BLUE SHIELD | Admitting: Medical

## 2015-02-04 ENCOUNTER — Encounter: Payer: Self-pay | Admitting: Medical

## 2015-02-04 VITALS — BP 146/88 | HR 82 | Temp 98.4°F | Ht 71.0 in | Wt 221.0 lb

## 2015-02-04 DIAGNOSIS — J029 Acute pharyngitis, unspecified: Secondary | ICD-10-CM | POA: Diagnosis not present

## 2015-02-04 DIAGNOSIS — R5383 Other fatigue: Secondary | ICD-10-CM

## 2015-02-04 LAB — COMPREHENSIVE METABOLIC PANEL
ALT: 50 U/L (ref 0–53)
AST: 35 U/L (ref 0–37)
Albumin: 4.4 g/dL (ref 3.5–5.2)
Alkaline Phosphatase: 68 U/L (ref 39–117)
BILIRUBIN TOTAL: 0.6 mg/dL (ref 0.2–1.2)
BUN: 10 mg/dL (ref 6–23)
CO2: 24 mEq/L (ref 19–32)
CREATININE: 1.12 mg/dL (ref 0.40–1.50)
Calcium: 9.6 mg/dL (ref 8.4–10.5)
Chloride: 104 mEq/L (ref 96–112)
GFR: 74.57 mL/min (ref >60.00)
Glucose, Bld: 83 mg/dL (ref 70–99)
POTASSIUM: 4 meq/L (ref 3.5–5.1)
SODIUM: 138 meq/L (ref 135–145)
Total Protein: 7.3 g/dL (ref 6.0–8.3)

## 2015-02-04 LAB — CBC WITH DIFFERENTIAL/PLATELET
Basophils Absolute: 0 10*3/uL (ref 0.0–0.1)
Basophils Relative: 0.5 % (ref 0.0–3.0)
EOS PCT: 3 % (ref 0.0–5.0)
Eosinophils Absolute: 0.2 10*3/uL (ref 0.0–0.7)
HEMATOCRIT: 46.3 % (ref 39.0–52.0)
HEMOGLOBIN: 15.8 g/dL (ref 13.0–17.0)
LYMPHS ABS: 2 10*3/uL (ref 0.7–4.0)
Lymphocytes Relative: 24.2 % (ref 12.0–46.0)
MCHC: 34.1 g/dL (ref 30.0–36.0)
MCV: 91.7 fl (ref 78.0–100.0)
Monocytes Absolute: 0.6 10*3/uL (ref 0.1–1.0)
Monocytes Relative: 6.7 % (ref 3.0–12.0)
NEUTROS ABS: 5.4 10*3/uL (ref 1.4–7.7)
Neutrophils Relative %: 65.6 % (ref 43.0–77.0)
Platelets: 279 10*3/uL (ref 150.0–400.0)
RBC: 5.05 Mil/uL (ref 4.22–5.81)
RDW: 12.6 % (ref 11.5–15.5)
WBC: 8.3 10*3/uL (ref 4.0–10.5)

## 2015-02-04 LAB — SEDIMENTATION RATE: Sed Rate: 5 mm/hr (ref 0–22)

## 2015-02-04 LAB — MONONUCLEOSIS SCREEN: MONO SCREEN: NEGATIVE

## 2015-02-04 LAB — TSH: TSH: 1.73 u[IU]/mL (ref 0.35–4.50)

## 2015-02-04 MED ORDER — TRAMADOL HCL 50 MG PO TABS: 50.0000 mg | ORAL_TABLET | Freq: Four times a day (QID) | ORAL | Status: DC | PRN

## 2015-02-04 MED ORDER — FLUCONAZOLE 150 MG PO TABS: ORAL_TABLET | ORAL | Status: DC

## 2015-02-04 NOTE — Assessment & Plan Note (Signed)
Rapid strep, monospot and send out throat culture today.

## 2015-02-04 NOTE — Addendum Note (Signed)
Addended by: Silvio PateHOMPSON, Henley Boettner D on: 02/04/2015 02:58 PM   Modules accepted: Orders

## 2015-02-04 NOTE — Progress Notes (Signed)
Pre visit review using our clinic review tool, if applicable. No additional management support is needed unless otherwise documented below in the visit note. 

## 2015-02-04 NOTE — Patient Instructions (Signed)
Start diflucan as fungal infection may be in differential after recent antibiotics.  Use probiotic as well since antibiotics may have altered natural flora.  Tramadol for pain  Please get labs today and monospot screen.  After your ENT work up if no cause found would refer you to GI.  If any major sign or symptom change before ENT evaluation then go to ED.  Follow up 7 days or as needed

## 2015-02-04 NOTE — Progress Notes (Signed)
Subjective:    Patient ID: Todd Roman, male    DOB: August 11, 1965, 49 y.o.   MRN: [id number]  HPI  Pt in states he has some rt side neck pain. He points to rt submandibular region. He describes some rt side nasal congestion. He demonstrates that rt nare has poor flow. When he did a netty pot had poor flow.   Pt has some st recurrent. He states after z-pack he felt better. Previously he was on antibiotic after +rapid strep(I gave him augmentin and rocephin on 1st visit since his throat exam was impressive).He felt better after rocpehin + augmentin  but then presented second time then I gave him azithromycin.   After taking zpack he states that he felt great but then past Saturday recurrent st.  This past Saturday when st  symptoms started to reoccur then started to feel lethargic. Then Sunday night his throat pain was severe. Some fatigue. He states throat pain is severe when he swallows.  No sob or wheezing.  Pt face is flushed. Pt rt ear hurts just a little bit behind the ear.(But not on exam when I palpate)  Pt [facility].    Review of Systems  Constitutional: Positive for fatigue. Negative for fever and chills.  HENT: Positive for congestion, ear pain and sinus pressure. Negative for nosebleeds, postnasal drip and sore throat.        Faint congestion and slight pain behind rt ear(but no pain on exam rt mastoid area)  Faint rt sinus pressure.  Respiratory: Negative for cough, chest tightness and wheezing.   Cardiovascular: Negative for chest pain and palpitations.  Gastrointestinal: Negative.   Musculoskeletal: Negative for back pain.  Neurological: Negative for dizziness, syncope, weakness, numbness and headaches.  Hematological: Positive for adenopathy.       Possible rt submandibular.  Psychiatric/Behavioral: Negative for behavioral problems and confusion.   Past Medical History  Diagnosis Date  . GERD  (gastroesophageal reflux disease)   . Symptomatic cholelithiasis 10/2014    Dr. Derrell Lollingamirez    History   Social History  . Marital Status: Married    Spouse Name: N/A  . Number of Children: 1  . Years of Education: N/A   Occupational History  . car sales -- [employer]    Social History Main Topics  . Smoking status: Never Smoker   . Smokeless tobacco: Never Used  . Alcohol Use: 9.0 oz/week    15 Cans of beer per week     Comment: 20-25 "servings" of alcohol a week  . Drug Use: No  . Sexual Activity: Not on file   Other Topics Concern  . Not on file   Social History Narrative   Household- pt , wife, son    Past Surgical History  Procedure Laterality Date  . No past surgeries      Family History  Problem Relation Age of Onset  . CAD Neg Hx   . Colon cancer Neg Hx   . Prostate cancer Neg Hx   . Stroke Mother     M and Salvadore DomGF  . Alcohol abuse Mother   . Alcohol abuse Maternal Aunt     No Known Allergies  Current Outpatient Prescriptions on File Prior to Visit  Medication Sig Dispense Refill  . aspirin 162 MG EC tablet Take 162 mg by mouth daily.    Marland Kitchen. azithromycin (ZITHROMAX) 250 MG tablet Take 2 tablets by mouth on day  1, followed by 1 tablet by mouth daily for 4 days. (Patient not taking: Reported on 02/04/2015) 6 tablet 0  . cefdinir (OMNICEF) 300 MG capsule Take 1 capsule (300 mg total) by mouth 2 (two) times daily. (Patient not taking: Reported on 02/04/2015) 20 capsule 0  . Probiotic Product (PROBIOTIC DAILY) CAPS 1 po qd    . ranitidine (ZANTAC) 150 MG tablet 1 po qd prn 30 tablet 3   No current facility-administered medications on file prior to visit.    BP 146/88 mmHg  Pulse 82  Temp(Src) 98.4 F (36.9 C) (Oral)  Ht  (1.803 m)  Wt 221 lb (100.245 kg)  BMI 30.84 kg/m2  SpO2 99%       Objective:   Physical Exam  General  Mental Status - Alert. General Appearance - Well groomed. Not in acute distress.  Skin Rashes- No Rashes.  HEENT Head-  Normal. Ear Auditory Canal - Left- Normal. Right - Normal.Tympanic Membrane- Left- Normal. Right- Normal. Eye Sclera/Conjunctiva- Left- Normal. Right- Normal. Nose & Sinuses Nasal Mucosa- Left-  Boggy and Congested. Right-  Boggy and  Congested.Bilateral maxillary and frontal sinus pressure. Septum does look deviated to the rt.  Mouth & Throat Lips: Upper Lip- Normal: no dryness, cracking, pallor, cyanosis, or vesicular eruption. Lower Lip-Normal: no dryness, cracking, pallor, cyanosis or vesicular eruption. Buccal Mucosa- Bilateral- No Aphthous ulcers. Oropharynx- No Discharge or Erythema. Tonsils: Characteristics- Bilateral- faint  Erythema but no Congestion. Size/Enlargement- Bilateral- No enlargement. Discharge- bilateral-None.  Neck Neck- Supple. No Masses.no thyromegaly.   Chest and Lung Exam Auscultation: Breath Sounds:-Clear even and unlabored.  Cardiovascular Auscultation:Rythm- Regular, rate and rhythm. Murmurs & Other Heart Sounds:Ausculatation of the heart reveal- No Murmurs.  Lymphatic Head & Neck General Head & Neck Lymphatics: Bilateral: Description- Maybe faint enlarge rt submandibular node. But not prominent.  Neuro- Cn III- XII grossly intact.       Assessment & Plan:  Discussed case and management plan  with Dr. Abner Greenspan.   Start diflucan as fungal infection may be in differential after recent antibiotics.  Use probiotic as well since antibiotics may have altered natural flora.  Tramadol for pain  Please get labs today and monospot screen.  After your ENT work up if no cause found would refer you to GI.  If any major sign or symptom change before ENT evaluation then go to ED.  Follow up 7 days or as needed

## 2015-02-06 LAB — CULTURE, GROUP A STREP: Organism ID, Bacteria: NORMAL

## 2015-07-03 IMAGING — US US ABDOMEN COMPLETE
1 series · 14 of 25 positions shown · non-contrast
Comparison: None.

CLINICAL DATA: Right upper quadrant pain intermittent for 3 months.

EXAM:
ULTRASOUND ABDOMEN COMPLETE

[Series 1: us abdomen complete · 0.20mm/px · 14 of 88 slices shown]
[im 1/88]
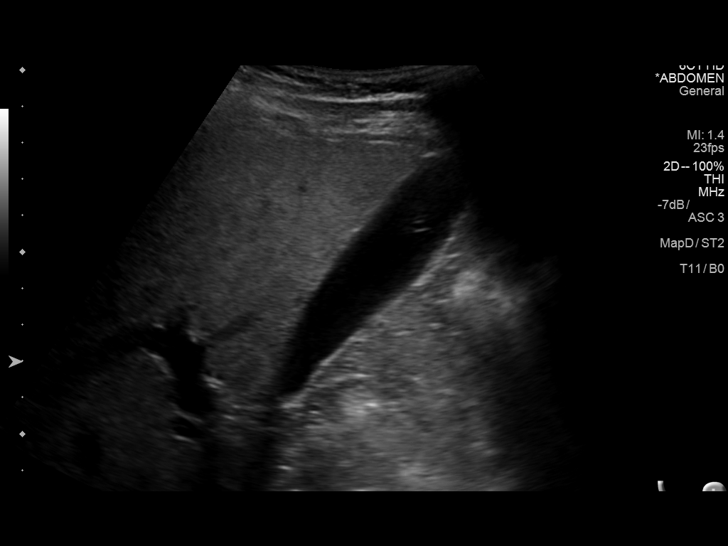
[im 8/88]
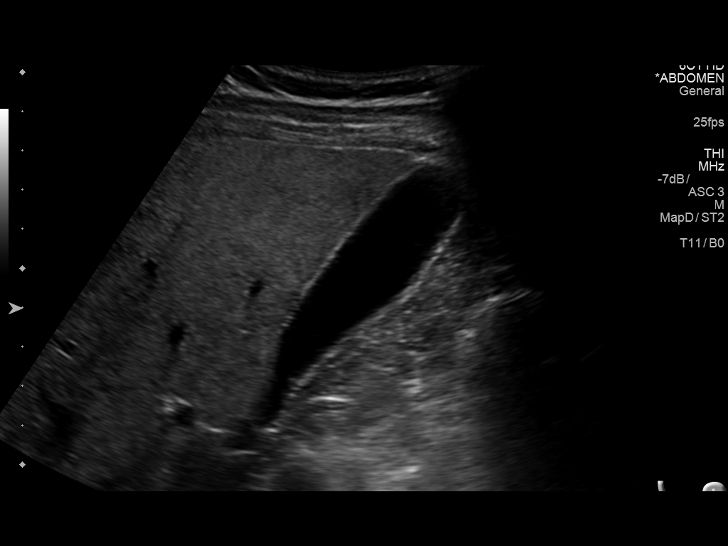
[im 15/88]
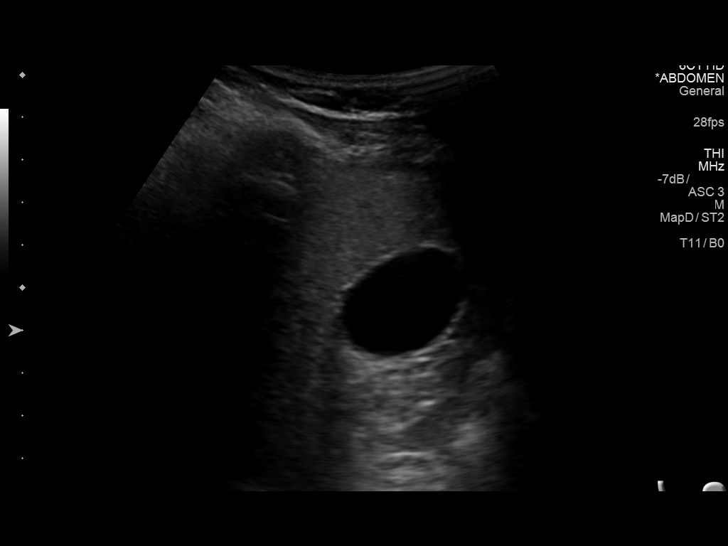
[im 22/88]
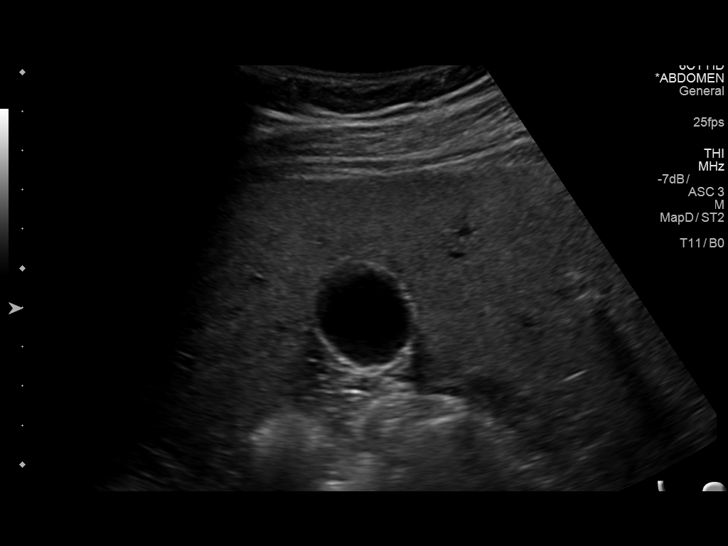
[im 30/88]
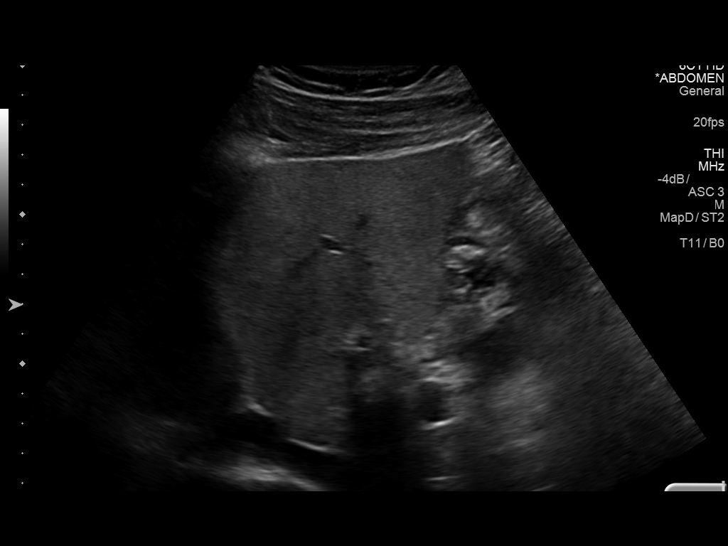
[im 33/88]
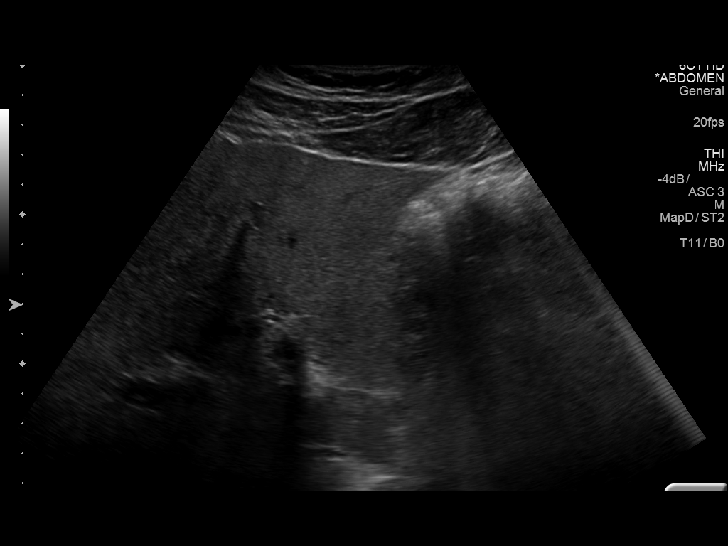
[im 40/88]
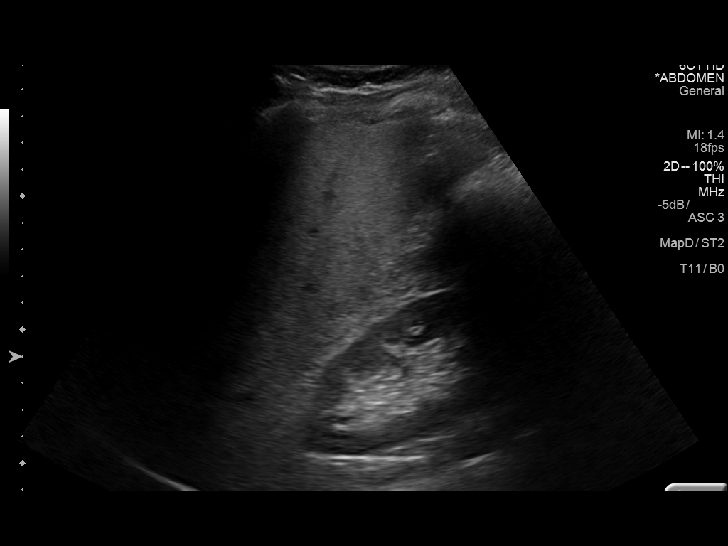
[im 48/88]
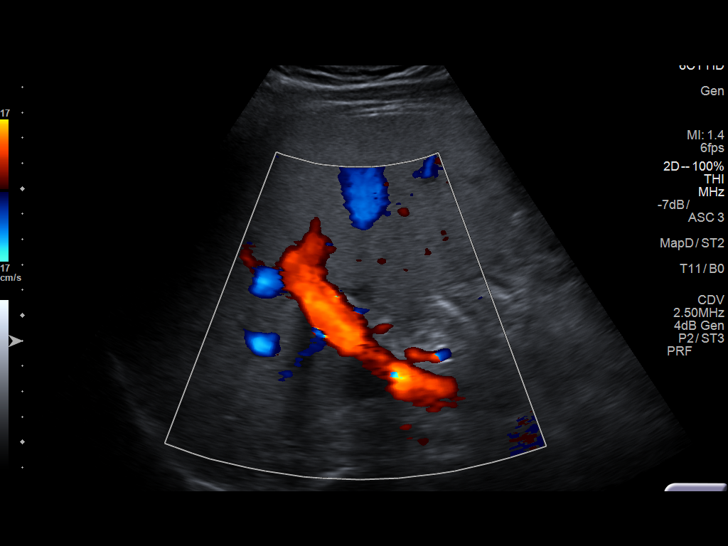
[im 55/88]
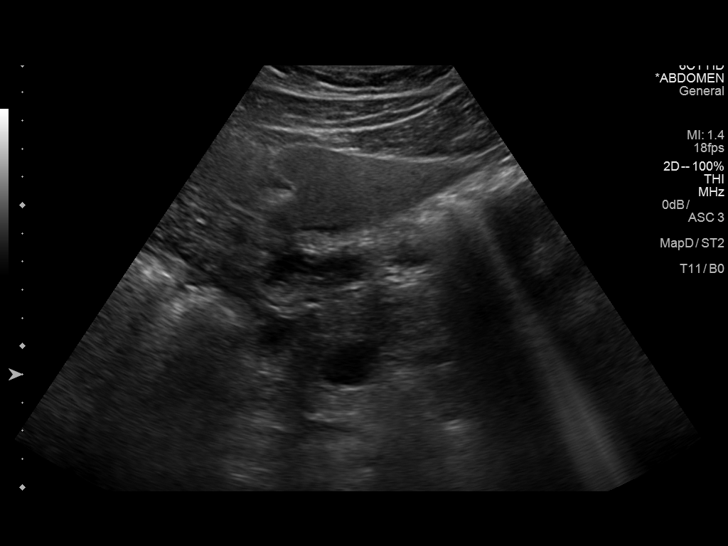
[im 59/88]
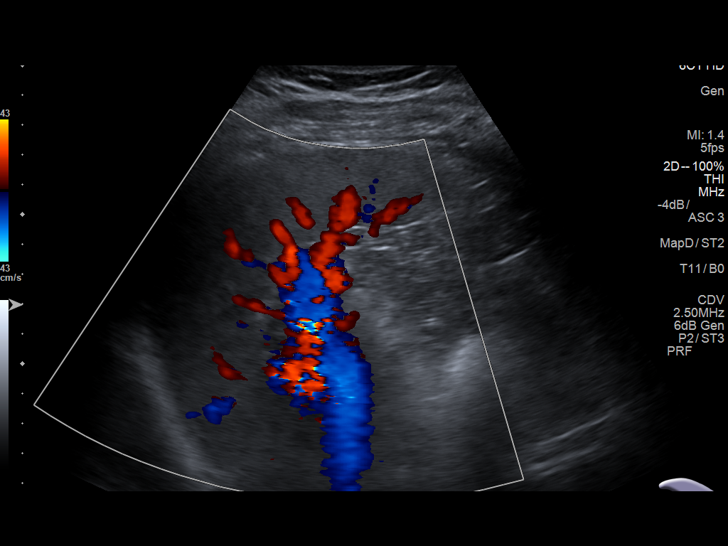
[im 66/88]
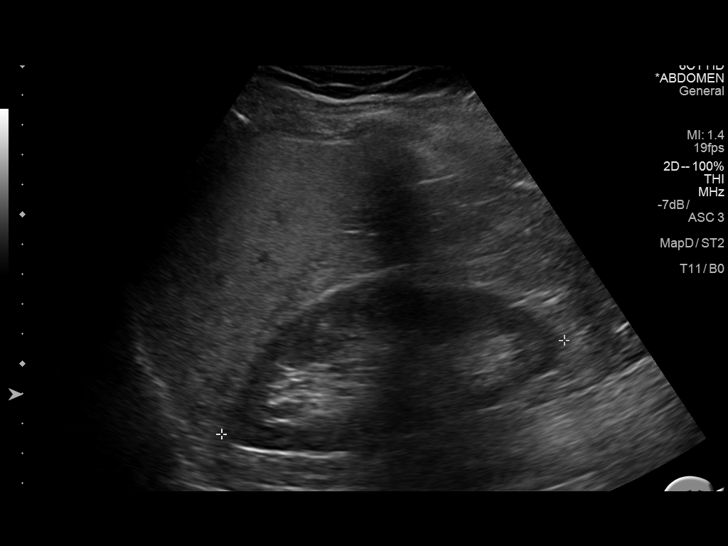
[im 73/88]
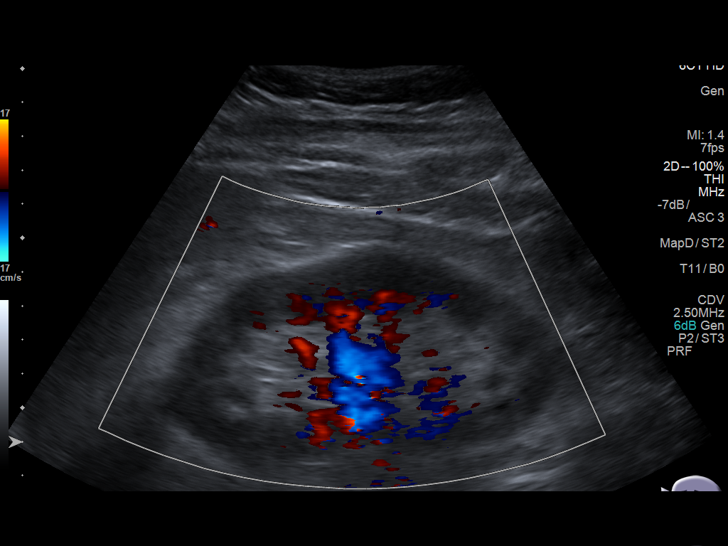
[im 80/88]
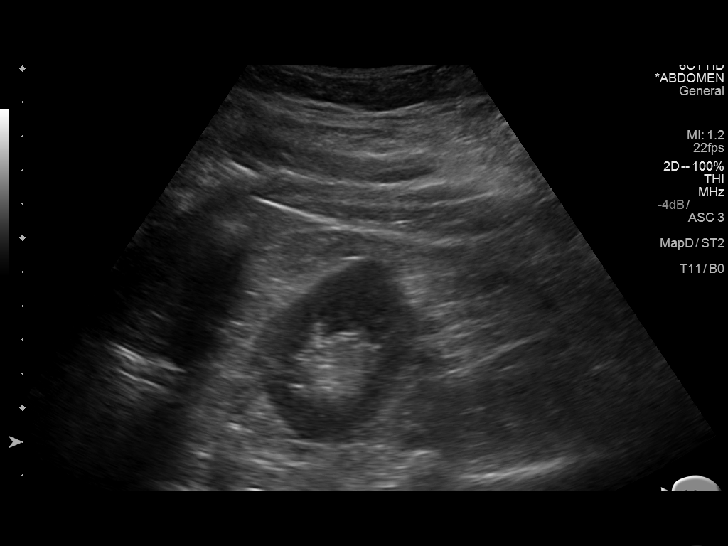
[im 88/88]
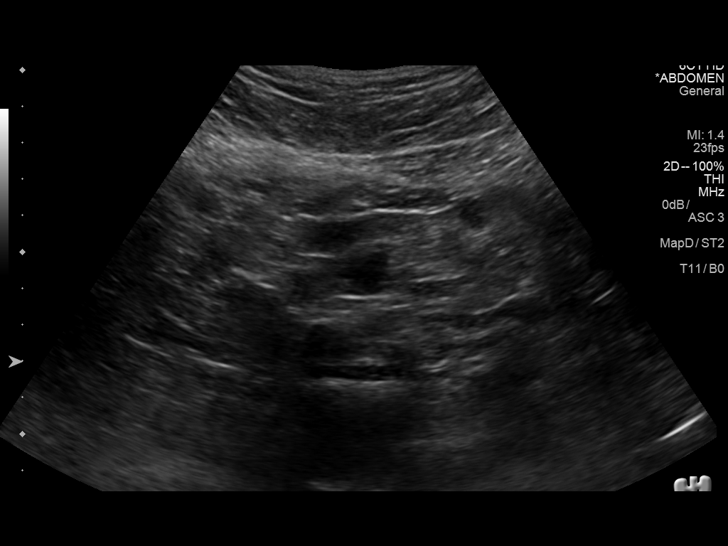

[14 of 25 positions shown; findings below may reference images not displayed]

FINDINGS: Gallbladder: No gallstones or wall thickening visualized. No
sonographic Murphy sign noted.

Common bile duct: Diameter: Normal at 3 mm.

Liver: Liver is diffusely increased in echogenicity similar prior.
No biliary duct dilatation.

IVC: No abnormality visualized.

Pancreas: Visualized portion unremarkable.

Spleen: Size and appearance within normal limits.

Right Kidney: Length: 11.9 cm. Echogenicity within normal limits. No
mass or hydronephrosis visualized.

Left Kidney: Length: 12.4 cm. Echogenicity within normal limits. No
mass or hydronephrosis visualized.

Abdominal aorta: No aneurysm visualized.

Other findings:  No ascites
IMPRESSION: 1. Normal gallbladder.
2. No biliary duct dilatation.
3. Increased hepatic echogenicity is most commonly hepatic
steatosis.

## 2015-08-26 IMAGING — US US ABDOMEN COMPLETE
1 series · 14 of 25 positions shown · non-contrast
Comparison: None.

CLINICAL DATA: Right upper quadrant pain.

EXAM:
ULTRASOUND ABDOMEN COMPLETE

[Series 2: us abdomen complete · 0.19mm/px · 14 of 66 slices shown]
[im 1/66]
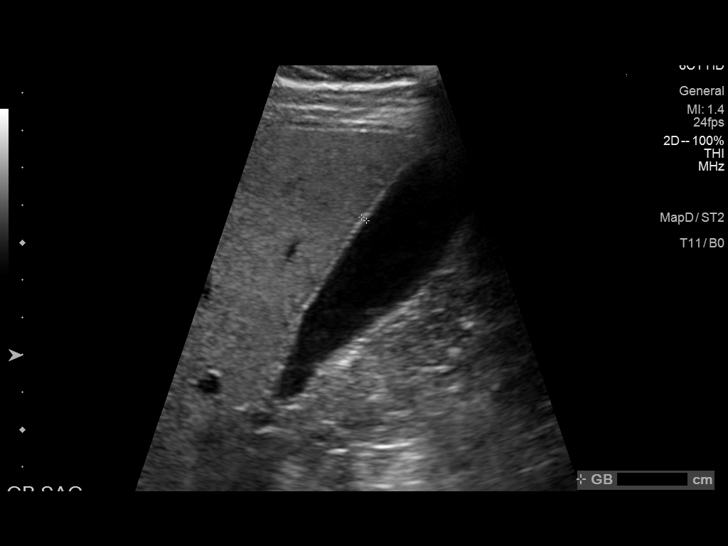
[im 6/66]
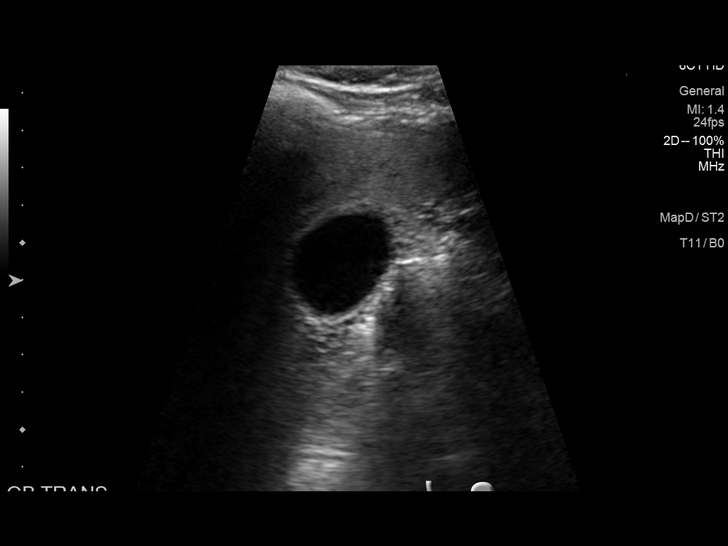
[im 11/66]
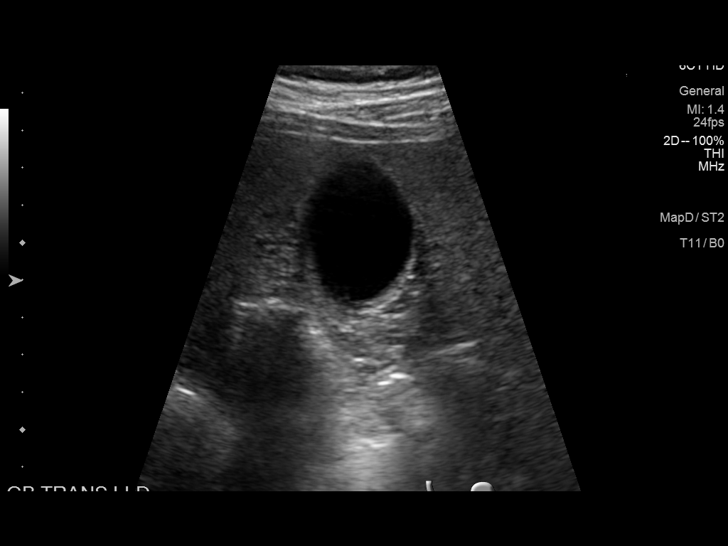
[im 17/66]
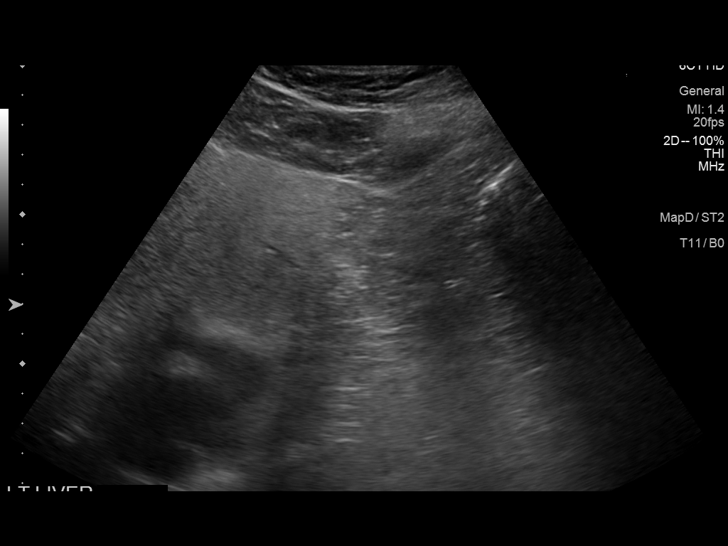
[im 22/66]
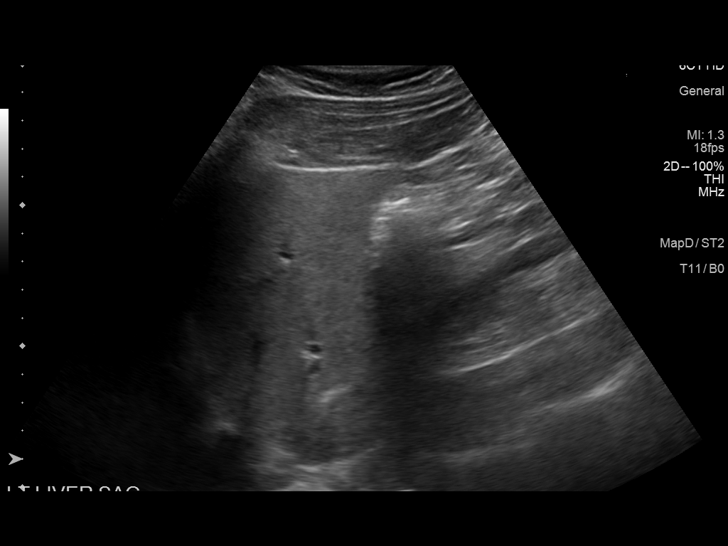
[im 25/66]
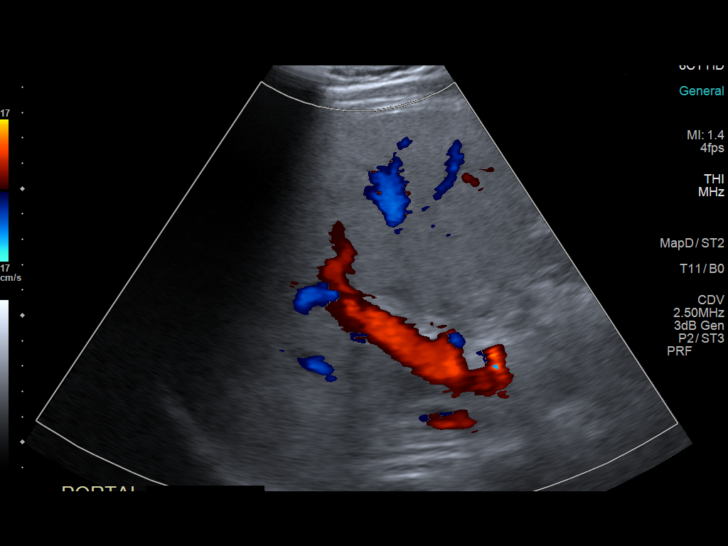
[im 30/66]
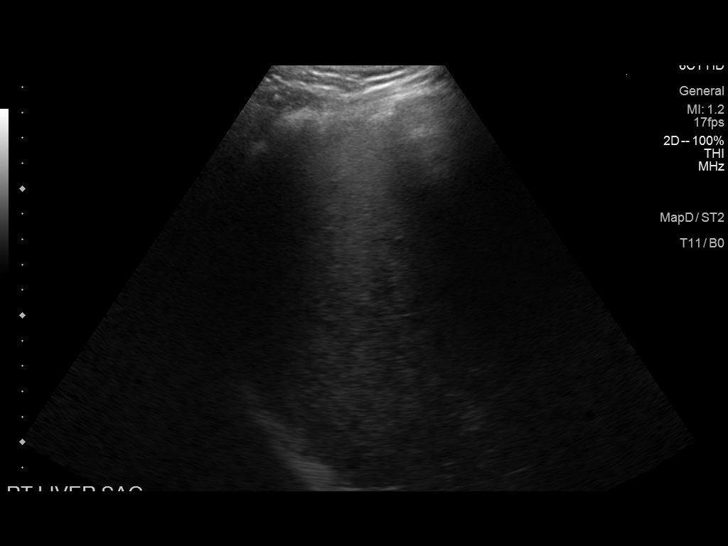
[im 36/66]
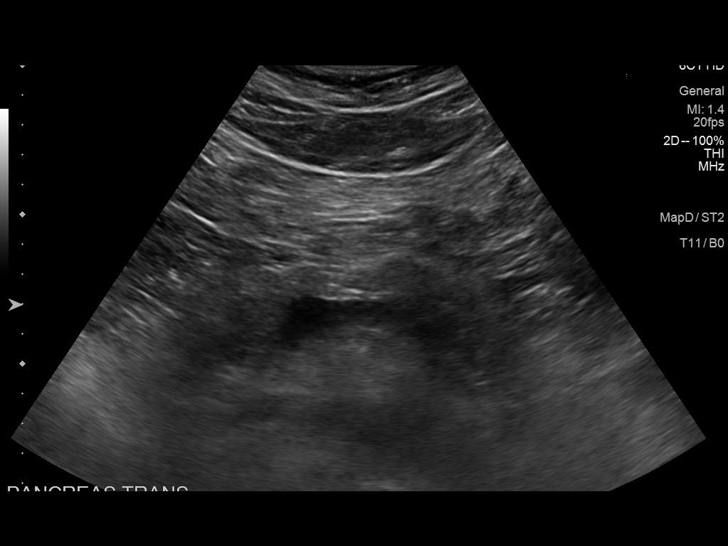
[im 41/66]
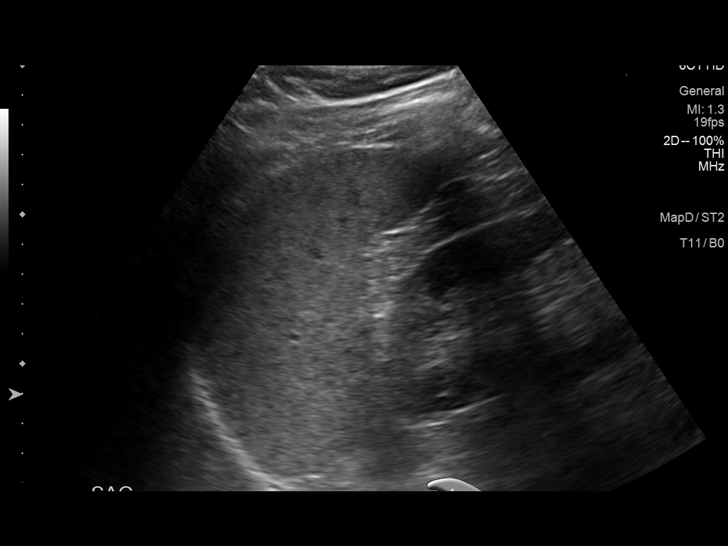
[im 44/66]
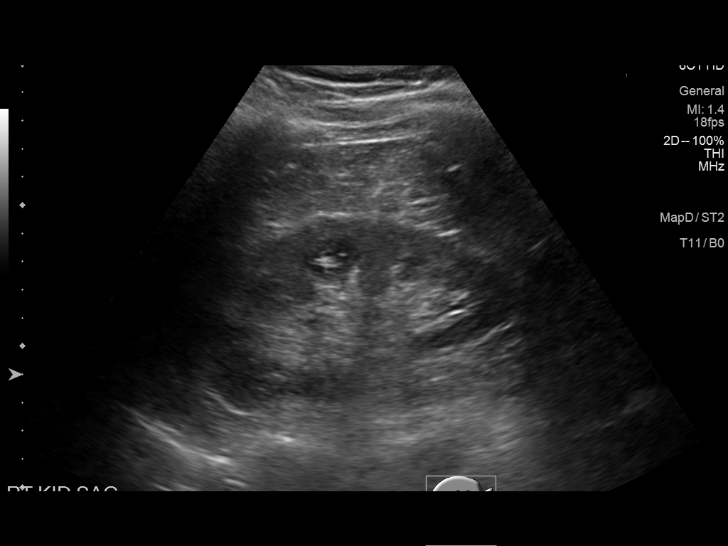
[im 49/66]
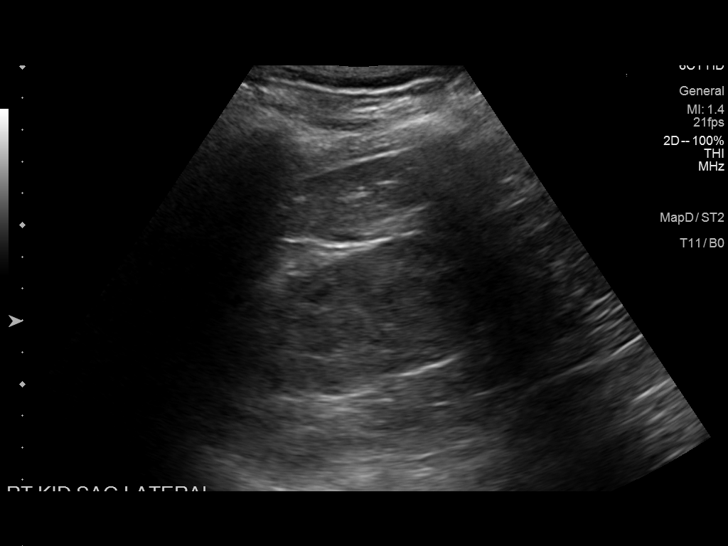
[im 55/66]
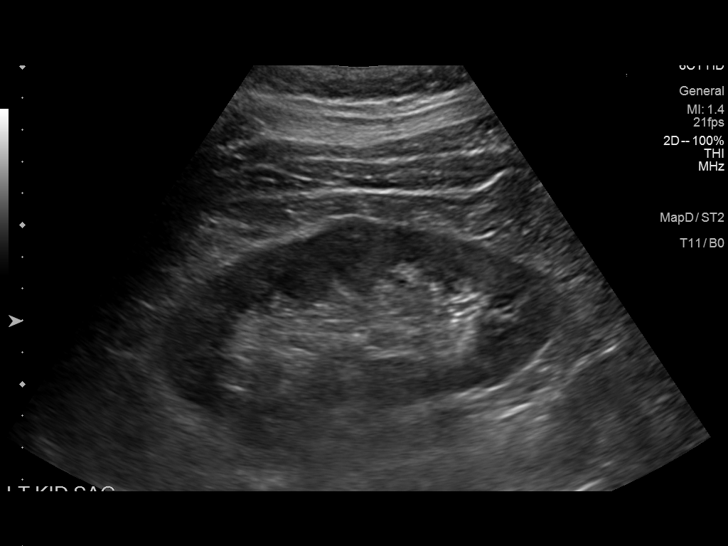
[im 60/66]
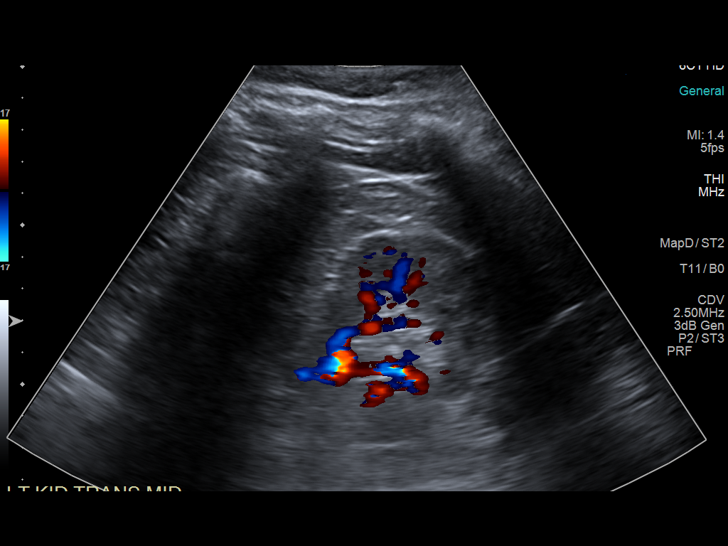
[im 66/66]
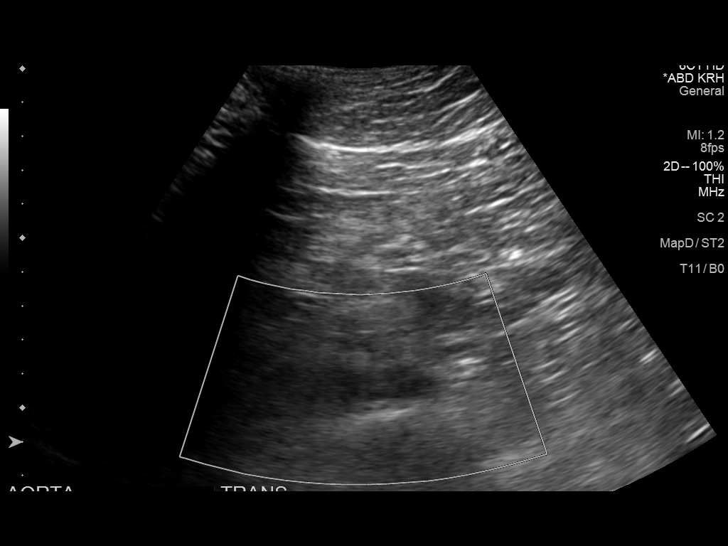

[14 of 25 positions shown; findings below may reference images not displayed]

FINDINGS: Gallbladder: Sludge noted in the gallbladder. No gallstones.
Gallbladder wall thickness 2 mm. Negative Murphy sign.

Common bile duct: Diameter: 2 mm.

Liver: Liver is echogenic consistent with fatty infiltration. No
focal lesions.

IVC: No abnormality visualized.

Pancreas: Visualized portion unremarkable.

Spleen: Size and appearance within normal limits.

Right Kidney: Length: 11.6 cm. Echogenicity within normal limits. No
mass or hydronephrosis visualized.

Left Kidney: Length: 12.9 cm. Echogenicity within normal limits. No
mass or hydronephrosis visualized.

Abdominal aorta: No aneurysm visualized.

Other findings: None.
IMPRESSION: 1. Sludge noted in the gallbladder.
2. Fatty infiltration of the liver.

## 2016-03-17 DIAGNOSIS — R1011 Right upper quadrant pain: Secondary | ICD-10-CM | POA: Diagnosis not present

## 2016-03-17 DIAGNOSIS — R1314 Dysphagia, pharyngoesophageal phase: Secondary | ICD-10-CM | POA: Diagnosis not present

## 2016-04-10 DIAGNOSIS — K259 Gastric ulcer, unspecified as acute or chronic, without hemorrhage or perforation: Secondary | ICD-10-CM | POA: Diagnosis not present

## 2016-04-10 DIAGNOSIS — K222 Esophageal obstruction: Secondary | ICD-10-CM | POA: Diagnosis not present

## 2016-04-10 DIAGNOSIS — K269 Duodenal ulcer, unspecified as acute or chronic, without hemorrhage or perforation: Secondary | ICD-10-CM | POA: Diagnosis not present

## 2016-04-10 DIAGNOSIS — R1011 Right upper quadrant pain: Secondary | ICD-10-CM | POA: Diagnosis not present

## 2016-04-10 DIAGNOSIS — K2950 Unspecified chronic gastritis without bleeding: Secondary | ICD-10-CM | POA: Diagnosis not present

## 2016-04-10 DIAGNOSIS — K2970 Gastritis, unspecified, without bleeding: Secondary | ICD-10-CM | POA: Diagnosis not present

## 2016-04-10 DIAGNOSIS — Z0000 Encounter for general adult medical examination without abnormal findings: Secondary | ICD-10-CM | POA: Diagnosis not present

## 2016-04-10 DIAGNOSIS — R1314 Dysphagia, pharyngoesophageal phase: Secondary | ICD-10-CM | POA: Diagnosis not present

## 2016-04-10 DIAGNOSIS — R1310 Dysphagia, unspecified: Secondary | ICD-10-CM | POA: Diagnosis not present

## 2016-04-13 ENCOUNTER — Encounter: Payer: Self-pay | Admitting: Internal Medicine

## 2016-04-13 NOTE — Telephone Encounter (Signed)
Last visit w/ PCP was 04/2014. Please advise.

## 2016-04-25 ENCOUNTER — Ambulatory Visit: Payer: Self-pay | Admitting: Internal Medicine

## 2017-05-29 DIAGNOSIS — L249 Irritant contact dermatitis, unspecified cause: Secondary | ICD-10-CM | POA: Diagnosis not present

## 2017-09-26 ENCOUNTER — Ambulatory Visit: Payer: Self-pay | Admitting: *Deleted

## 2017-09-26 NOTE — Telephone Encounter (Signed)
Called in concerned about BP being slightly elevated.  Readings of 132/97;  125/91 and 124/95.   He took it just now and it was 149/109.  He has an appt for a physical on 10/03/17 with Dr. Drue NovelPaz per the protocol he can follow up with Dr. Drue NovelPaz at his physical so I keep that appt.  Went over the s/s to look for and that he should call us back for or call 911.  He verbalized understanding of these instructions and agreed to this plan. Reason for Disposition . [1] Systolic BP  >= 140 OR Diastolic >= 90 AND [2] not taking BP medications  Answer Assessment - Initial Assessment Questions 1. BLOOD PRESSURE: "What is the blood pressure?" "Did you take at least two measurements 5 minutes apart?"     No history no medications.   I've been monitoring it for about 5 yrs.    2. ONSET: "When did you take your blood pressure?"     I feel flushed in my face and neck.   Maybe fuzzy headed.   I also have sleep apnea.    I feel all the blood is rushing to my head.   I get hot flashes.    3. HOW: "How did you obtain the blood pressure?" (e.g., visiting nurse, automatic home BP monitor)     Have an automatic BP cuff.   I usually have "low" BP.   I'm very active. 4. HISTORY: "Do you have a history of high blood pressure?"     No 5. MEDICATIONS: "Are you taking any medications for blood pressure?" "Have you missed any doses recently?"     No medications for BP.    Maybe an occasional aspirin.  149/109 now 6. OTHER SYMPTOMS: "Do you have any symptoms?" (e.g., headache, chest pain, blurred vision, difficulty breathing, weakness)     I feel a little pressure in my head. 7. PREGNANCY: "Is there any chance you are pregnant?" "When was your last menstrual period?"     N/A  Protocols used: HIGH BLOOD PRESSURE-A-AH

## 2017-09-27 NOTE — Telephone Encounter (Signed)
FYI

## 2017-09-28 ENCOUNTER — Encounter: Payer: Self-pay | Admitting: Internal Medicine

## 2017-09-28 NOTE — Telephone Encounter (Signed)
Noted, sending a mychart message

## 2017-10-03 ENCOUNTER — Ambulatory Visit (INDEPENDENT_AMBULATORY_CARE_PROVIDER_SITE_OTHER): Payer: BLUE CROSS/BLUE SHIELD | Admitting: Internal Medicine

## 2017-10-03 ENCOUNTER — Encounter: Payer: Self-pay | Admitting: Internal Medicine

## 2017-10-03 VITALS — BP 132/78 | HR 96 | Temp 98.3°F | Resp 14 | Ht 71.0 in | Wt 235.0 lb

## 2017-10-03 DIAGNOSIS — Z3009 Encounter for other general counseling and advice on contraception: Secondary | ICD-10-CM

## 2017-10-03 DIAGNOSIS — Z0000 Encounter for general adult medical examination without abnormal findings: Secondary | ICD-10-CM

## 2017-10-03 DIAGNOSIS — Z23 Encounter for immunization: Secondary | ICD-10-CM

## 2017-10-03 DIAGNOSIS — K269 Duodenal ulcer, unspecified as acute or chronic, without hemorrhage or perforation: Secondary | ICD-10-CM

## 2017-10-03 NOTE — Assessment & Plan Note (Addendum)
-  Td today -CCS: Patient will have a EGD at some point this year, rec to proceed with a cscope. -prostate ca : DRE normal, check a PSA -BMI 32, has gained around 15 pounds per our scales the last 3 years.  Rx diet, exercise -Labs: Will come back fasting for CMP, CBC, FLP, TSH, PSA, iron, ferritin, transferrin; declined  HIV -Refer to urology per patient request, vasectomy.

## 2017-10-03 NOTE — Patient Instructions (Addendum)
GO TO THE LAB : Get the blood work     GO TO THE FRONT DESK Please come back fasting for blood work   this week  Schedule your next appointment for a second exam in 1 year   Check the  blood pressure monthly   Be sure your blood pressure is between 110/65 and  135/85. If it is consistently higher or lower, let me know

## 2017-10-03 NOTE — Progress Notes (Signed)
Pre visit review using our clinic review tool, if applicable. No additional management support is needed unless otherwise documented below in the visit note. 

## 2017-10-03 NOTE — Progress Notes (Signed)
Subjective:    Patient ID: Todd Roman, male    DOB: 1966-04-11, 52 y.o.   MRN: [id number]  DOS:  10/03/2017 Type of visit - description : CPX Interval history: Here for a CPX, last visit with me 2015.  Wt Readings from Last 3 Encounters:  10/03/17 235 lb (106.6 kg)  02/04/15 221 lb (100.2 kg)  01/21/15 220 lb 9.6 oz (100.1 kg)    Review of Systems In general feeling well. No GI symptoms, takes zantac prn He did notice occasional facial flushing on and off for at least 2 years associated with a feeling of swelling in his hands. Denies pruritus when he takes a shower When he wakes up in the morning he does not feel rested, wonders if he has a sleep apnea.  Reports very mild snoring but denies fatigue or feeling sleepy.  Other than above, a 14 point review of systems is negative     Past Medical History:  Diagnosis Date  . Duodenal ulcer   . GERD (gastroesophageal reflux disease)   . H/O food poisoning    diarrhea, [facility]  . Symptomatic cholelithiasis 10/2014   Dr. Derrell Lolling  . 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 NO PAST SURGERIES      Social History   Socioeconomic History  . Marital status: Married    Spouse name: Not on file  . Number of children: 1  . Years of education: Not on file  . Highest education level: Not on file  Social Needs  . Financial resource strain: Not on file  . Food insecurity - worry: Not on file  . Food insecurity - inability: Not on file  . Transportation needs - medical: Not on file  . Transportation needs - non-medical: Not on file  Occupational History  . Occupation: Advertising account planner   Tobacco Use  . Smoking status: Light Tobacco Smoker  . Smokeless tobacco: Never Used  . Tobacco comment: occ cigar  Substance and Sexual Activity  . Alcohol use: Yes    Alcohol/week: 9.0 oz    Types: 15 Cans of beer per week    Comment: no daily , a beer now and  then  . Drug use: No  . Sexual activity: Not on file  Other Topics Concern  . Not on file  Social History Narrative   Household- pt , wife, son     Family History  Problem Relation Age of Onset  . Stroke Mother 15       M and GF  . Alcohol abuse Mother   . Heart failure Mother   . Alcohol abuse Maternal Aunt   . CAD Neg Hx   . Colon cancer Neg Hx   . Prostate cancer Neg Hx      Allergies as of 10/03/2017   No Known Allergies     Medication List        Accurate as of 10/03/17 11:59 PM. Always use your most recent med list.          aspirin EC 81 MG tablet Take 81 mg by mouth daily.   PROBIOTIC DAILY Caps 1 po qd   ranitidine 150 MG tablet Commonly known as:  ZANTAC 1 po qd prn          Objective:   Physical Exam BP 132/78 (BP Location: Left Arm, Patient Position: Sitting, Cuff Size: Normal)   Pulse 96   Temp  98.3 F (36.8 C) (Oral)   Resp 14   Ht 5\' 11"  (1.803 m)   Wt 235 lb (106.6 kg)   SpO2 97%   BMI 32.78 kg/m  General:   Well developed, well nourished . NAD.  Neck: No  thyromegaly  HEENT:  Normocephalic . Face symmetric, atraumatic. Face is somewhat rubicund Lungs:  CTA B Normal respiratory effort, no intercostal retractions, no accessory muscle use. Heart: RRR,  no murmur.  No pretibial edema bilaterally  Abdomen:  Not distended, soft, non-tender. No rebound or rigidity.   Rectal: External abnormalities: none. Normal sphincter tone. No rectal masses or tenderness.  Stools not found Prostate: Prostate gland firm and smooth, no enlargement, nodularity, tenderness, mass, asymmetry or induration Skin: Exposed areas without rash. Not pale. Not jaundice Neurologic:  alert & oriented X3.  Speech normal, gait appropriate for age and unassisted Strength symmetric and appropriate for age.  Psych: Cognition and judgment appear intact.  Cooperative with normal attention span and concentration.  Behavior appropriate. No anxious or depressed  appearing.     Assessment & Plan:   Assessment GERD Abd pain: GB sludge per US 2016, saw Dr Joaquim Namamirez,no surgery, then was dx w/ a ulcer see below H/o  duodenal ulcer by  EGD 04/12/2016 BX no celiac disease, + chronic inflammation no H. pylori, no eosinophilic esophagitis.  No Barrett's H/o migraines (rare sx as of 2019)   Plan: H/o GERD and a duodenal ulcer: Currently asx, takes  Zantac as needed.  Pt plan is to see GI in few months and get a follow-up endoscopy.  Rec to also request a colonoscopy for [facility]. Facial flushing:  occ has facial flushing, on exam he is somewhat rubicund  Will check a iron and ferritin levels. OSA?  Minimal snoring, Epworth scale negative.  Rx observation. Patient concerned about his BP, see recent message, recommend ambulatory BPs RTC 1 year

## 2017-10-04 ENCOUNTER — Other Ambulatory Visit: Payer: BLUE CROSS/BLUE SHIELD

## 2017-10-04 DIAGNOSIS — Z09 Encounter for follow-up examination after completed treatment for conditions other than malignant neoplasm: Secondary | ICD-10-CM | POA: Insufficient documentation

## 2017-10-04 NOTE — Assessment & Plan Note (Signed)
H/o GERD and a duodenal ulcer: Currently asx, takes  Zantac as needed.  Pt plan is to see GI in few months and get a follow-up endoscopy.  Rec to also request a colonoscopy for [facility]. Facial flushing:  occ has facial flushing, on exam he is somewhat rubicund  Will check a iron and ferritin levels. OSA?  Minimal snoring, Epworth scale negative.  Rx observation. Patient concerned about his BP, see recent message, recommend ambulatory BPs RTC 1 year

## 2017-10-11 ENCOUNTER — Other Ambulatory Visit (INDEPENDENT_AMBULATORY_CARE_PROVIDER_SITE_OTHER): Payer: BLUE CROSS/BLUE SHIELD

## 2017-10-11 DIAGNOSIS — Z0000 Encounter for general adult medical examination without abnormal findings: Secondary | ICD-10-CM

## 2017-10-11 LAB — CBC WITH DIFFERENTIAL/PLATELET
Basophils Absolute: 0.1 10*3/uL (ref 0.0–0.1)
Basophils Relative: 0.7 % (ref 0.0–3.0)
EOS ABS: 0.4 10*3/uL (ref 0.0–0.7)
EOS PCT: 4.8 % (ref 0.0–5.0)
HCT: 46.8 % (ref 39.0–52.0)
HEMOGLOBIN: 16.4 g/dL (ref 13.0–17.0)
LYMPHS PCT: 20.4 % (ref 12.0–46.0)
Lymphs Abs: 1.5 10*3/uL (ref 0.7–4.0)
MCHC: 35 g/dL (ref 30.0–36.0)
MCV: 93 fl (ref 78.0–100.0)
MONO ABS: 0.8 10*3/uL (ref 0.1–1.0)
Monocytes Relative: 10.3 % (ref 3.0–12.0)
Neutro Abs: 4.8 10*3/uL (ref 1.4–7.7)
Neutrophils Relative %: 63.8 % (ref 43.0–77.0)
Platelets: 230 10*3/uL (ref 150.0–400.0)
RBC: 5.03 Mil/uL (ref 4.22–5.81)
RDW: 12.3 % (ref 11.5–15.5)
WBC: 7.5 10*3/uL (ref 4.0–10.5)

## 2017-10-11 LAB — COMPREHENSIVE METABOLIC PANEL
ALBUMIN: 4.4 g/dL (ref 3.5–5.2)
ALT: 59 U/L — ABNORMAL HIGH (ref 0–53)
AST: 30 U/L (ref 0–37)
Alkaline Phosphatase: 95 U/L (ref 39–117)
BUN: 10 mg/dL (ref 6–23)
CALCIUM: 9.7 mg/dL (ref 8.4–10.5)
CHLORIDE: 104 meq/L (ref 96–112)
CO2: 28 mEq/L (ref 19–32)
Creatinine, Ser: 1.23 mg/dL (ref 0.40–1.50)
GFR: 66.19 mL/min (ref >60.00)
Glucose, Bld: 102 mg/dL — ABNORMAL HIGH (ref 70–99)
POTASSIUM: 4.4 meq/L (ref 3.5–5.1)
Sodium: 139 mEq/L (ref 135–145)
Total Bilirubin: 0.9 mg/dL (ref 0.2–1.2)
Total Protein: 6.9 g/dL (ref 6.0–8.3)

## 2017-10-11 LAB — IBC PANEL
Iron: 125 ug/dL (ref 42–165)
Saturation Ratios: 35.7 % (ref 20.0–50.0)
Transferrin: 250 mg/dL (ref 212.0–360.0)

## 2017-10-11 LAB — LIPID PANEL
CHOLESTEROL: 215 mg/dL — AB (ref 0–200)
HDL: 44.3 mg/dL (ref >39.00)
LDL Cholesterol: 134 mg/dL — ABNORMAL HIGH (ref 0–99)
NonHDL: 170.33
TRIGLYCERIDES: 181 mg/dL — AB (ref 0.0–149.0)
Total CHOL/HDL Ratio: 5
VLDL: 36.2 mg/dL (ref 0.0–40.0)

## 2017-10-11 LAB — TSH: TSH: 2.92 u[IU]/mL (ref 0.35–4.50)

## 2017-10-11 LAB — FERRITIN: FERRITIN: 241.9 ng/mL (ref 22.0–322.0)

## 2017-10-11 LAB — PSA: PSA: 0.96 ng/mL (ref 0.10–4.00)

## 2017-10-20 ENCOUNTER — Encounter: Payer: Self-pay | Admitting: Internal Medicine

## 2017-11-05 DIAGNOSIS — Z3009 Encounter for other general counseling and advice on contraception: Secondary | ICD-10-CM | POA: Diagnosis not present

## 2018-02-14 DIAGNOSIS — Z0110 Encounter for examination of ears and hearing without abnormal findings: Secondary | ICD-10-CM | POA: Diagnosis not present

## 2018-02-14 DIAGNOSIS — H9311 Tinnitus, right ear: Secondary | ICD-10-CM | POA: Diagnosis not present

## 2018-08-09 ENCOUNTER — Encounter

## 2018-08-09 ENCOUNTER — Ambulatory Visit (INDEPENDENT_AMBULATORY_CARE_PROVIDER_SITE_OTHER): Payer: BLUE CROSS/BLUE SHIELD | Admitting: Medical

## 2018-08-09 ENCOUNTER — Telehealth: Payer: Self-pay | Admitting: Medical

## 2018-08-09 ENCOUNTER — Encounter: Payer: Self-pay | Admitting: Medical

## 2018-08-09 VITALS — BP 140/90 | HR 101 | Temp 98.2°F | Resp 16 | Ht 71.0 in | Wt 235.8 lb

## 2018-08-09 DIAGNOSIS — J40 Bronchitis, not specified as acute or chronic: Secondary | ICD-10-CM | POA: Diagnosis not present

## 2018-08-09 DIAGNOSIS — J029 Acute pharyngitis, unspecified: Secondary | ICD-10-CM | POA: Diagnosis not present

## 2018-08-09 DIAGNOSIS — R05 Cough: Secondary | ICD-10-CM | POA: Diagnosis not present

## 2018-08-09 DIAGNOSIS — M791 Myalgia, unspecified site: Secondary | ICD-10-CM

## 2018-08-09 DIAGNOSIS — R059 Cough, unspecified: Secondary | ICD-10-CM

## 2018-08-09 LAB — POCT RAPID STREP A (OFFICE): Rapid Strep A Screen: NEGATIVE

## 2018-08-09 MED ORDER — FLUTICASONE PROPIONATE 50 MCG/ACT NA SUSP: 2.0000 | Freq: Every day | NASAL | 1 refills | Status: DC

## 2018-08-09 MED ORDER — ALBUTEROL SULFATE HFA 108 (90 BASE) MCG/ACT IN AERS: 2.0000 | INHALATION_SPRAY | Freq: Four times a day (QID) | RESPIRATORY_TRACT | 2 refills | Status: DC | PRN

## 2018-08-09 MED ORDER — AMOXICILLIN-POT CLAVULANATE 875-125 MG PO TABS: 1.0000 | ORAL_TABLET | Freq: Two times a day (BID) | ORAL | 0 refills | Status: DC

## 2018-08-09 MED ORDER — HYDROCODONE-HOMATROPINE 5-1.5 MG/5ML PO SYRP: 5.0000 mL | ORAL_SOLUTION | Freq: Four times a day (QID) | ORAL | 0 refills | Status: DC | PRN

## 2018-08-09 NOTE — Progress Notes (Signed)
Subjective:    Patient ID: Todd Roman, male    DOB: 1966-04-04, 53 y.o.   MRN: [id number]  HPI  Pt in states sick for 6 days. Pt states started with st, and body aches over weekend. Monday got sneezing and coughing. Tuesday st decreased to level 7/10. But Tuesday night got ha and sinus pressure.  Pt has taking tylenol and nyquil.   He describes sumbandibular node tendereness.   Review of Systems  Constitutional: Negative for chills.  HENT: Positive for congestion and sinus pain. Negative for ear pain, postnasal drip and sneezing.   Eyes: Negative for pain.  Respiratory: Positive for cough and wheezing. Negative for chest tightness and shortness of breath.        Productive cough  Cardiovascular: Negative for chest pain and palpitations.  Gastrointestinal: Negative for abdominal pain and anal bleeding.  Musculoskeletal: Negative for back pain.  Neurological: Negative for dizziness, speech difficulty, weakness and light-headedness.  Hematological: Positive for adenopathy. Does not bruise/bleed easily.  Psychiatric/Behavioral: Negative for behavioral problems and confusion.    Past Medical History:  Diagnosis Date  . Duodenal ulcer   . GERD (gastroesophageal reflux disease)   . H/O food poisoning    diarrhea, [facility]  . Symptomatic cholelithiasis 10/2014   Dr. Derrell Lolling  . 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 Marital status: Married    Spouse name: Not on file  . Number of children: 1  . Years of education: Not on file  . Highest education level: Not on file  Occupational History  . Occupation: Advertising account planner   Social Needs  . Financial resource strain: Not on file  . Food insecurity:    Worry: Not on file    Inability: Not on file  . Transportation needs:    Medical: Not on file    Non-medical: Not on file  Tobacco Use  . Smoking status: Light Tobacco Smoker  . Smokeless tobacco: Never  Used  . Tobacco comment: occ cigar  Substance and Sexual Activity  . Alcohol use: Yes    Alcohol/week: 15.0 standard drinks    Types: 15 Cans of beer per week    Comment: no daily , a beer now and then  . Drug use: No  . Sexual activity: Not on file  Lifestyle  . Physical activity:    Days per week: Not on file    Minutes per session: Not on file  . Stress: Not on file  Relationships  . Social connections:    Talks on phone: Not on file    Gets together: Not on file    Attends religious service: Not on file    Active member of club or organization: Not on file    Attends meetings of clubs or organizations: Not on file    Relationship status: Not on file  . Intimate partner violence:    Fear of current or ex partner: Not on file    Emotionally abused: Not on file    Physically abused: Not on file    Forced sexual activity: Not on file  Other Topics Concern  . Not on file  Social History Narrative   Household- pt , wife, son    Past Surgical History:  Procedure Laterality Date  . NO PAST SURGERIES      Family History  Problem Relation Age of Onset  . Stroke Mother 79  M and GF  . Alcohol abuse Mother   . Heart failure Mother   . Alcohol abuse Maternal Aunt   . CAD Neg Hx   . Colon cancer Neg Hx   . Prostate cancer Neg Hx     No Known Allergies  Current Outpatient Medications on File Prior to Visit  Medication Sig Dispense Refill  . aspirin EC 81 MG tablet Take 81 mg by mouth daily.    . Probiotic Product (PROBIOTIC DAILY) CAPS 1 po qd    . ranitidine (ZANTAC) 150 MG tablet 1 po qd prn 30 tablet 3   No current facility-administered medications on file prior to visit.     BP (!) 143/100   Pulse (!) 101   Temp 98.2 F (36.8 C) (Oral)   Resp 16   Ht 5\' 11"  (1.803 m)   Wt 235 lb 12.8 oz (107 kg)   SpO2 99%   BMI 32.89 kg/m       Objective:   Physical Exam  General  Mental Status - Alert. General Appearance - Well groomed. Not in acute  distress.  Skin Rashes- No Rashes.  HEENT Head- Normal. Ear Auditory Canal - Left- Normal. Right - Normal.Tympanic Membrane- Left- Normal. Right- Normal. Eye Sclera/Conjunctiva- Left- Normal. Right- Normal. Nose & Sinuses Nasal Mucosa- Left-  Boggy and Congested. Right-  Boggy and  Congested.Bilateral maxillary and frontal sinus pressure. Mouth & Throat Lips: Upper Lip- Normal: no dryness, cracking, pallor, cyanosis, or vesicular eruption. Lower Lip-Normal: no dryness, cracking, pallor, cyanosis or vesicular eruption. Buccal Mucosa- Bilateral- No Aphthous ulcers. Oropharynx- No Discharge or Erythema. Tonsils: Characteristics- Bilateral- Erythema or Congestion. Size/Enlargement- Bilateral- 1+ enlargement. Discharge- bilateral-None.  Neck Neck- Supple. No Masses. Shoddy and tender submandibular nodes   Chest and Lung Exam Auscultation: Breath Sounds:-Clear even and unlabored.  Cardiovascular Auscultation:Rythm- Regular, rate and rhythm. Murmurs & Other Heart Sounds:Ausculatation of the heart reveal- No Murmurs.  Lymphatic Head & Neck General Head & Neck Lymphatics: Bilateral: Description- No Localized lymphadenopathy.       Assessment & Plan:  You had recent onset of symptoms that severe sore throat with myalgias.  Your rapid strep test and flu test were both negative.  Rapid strep test can be falsely negative at times in your throat exam appears suspicious.  You also have symptoms of bronchitis.  I am going to prescribe you Augmentin antibiotic.  Hycodan cough syrup.  Rx advisement given.  Flonase nasal spray for congestion.  Albuterol inhaler for wheezing.  You report some headache recently could be related to above illness.  However your blood pressure is borderline today.  Recommend stopping any over-the-counter decongestant type products.  Follow-up in 7 days or as needed.

## 2018-08-09 NOTE — Patient Instructions (Addendum)
You had recent onset of symptoms that severe sore throat with myalgias.  Your rapid strep test and flu test were both negative.  Rapid strep test can be falsely negative at times in your throat exam appears suspicious.  You also have symptoms of bronchitis that developed over the past week.  I am going to prescribe you Augmentin antibiotic.  Hycodan cough syrup.  Rx advisement given.  Flonase nasal spray for congestion.  Albuterol inhaler for wheezing.  You report some headache recently could be related to above illness.  However your blood pressure is borderline today.  Recommend stopping any over-the-counter decongestant type products.  Follow-up in 7 days or as needed.

## 2018-08-09 NOTE — Addendum Note (Signed)
Addended by: Orlene Och on: 08/09/2018 02:01 PM   Modules accepted: Orders

## 2018-08-09 NOTE — Telephone Encounter (Signed)
Done

## 2018-08-09 NOTE — Telephone Encounter (Signed)
Will you result strep and flu test.

## 2018-08-28 ENCOUNTER — Encounter: Payer: Self-pay | Admitting: Internal Medicine

## 2018-08-28 ENCOUNTER — Ambulatory Visit (INDEPENDENT_AMBULATORY_CARE_PROVIDER_SITE_OTHER): Payer: BLUE CROSS/BLUE SHIELD | Admitting: Internal Medicine

## 2018-08-28 VITALS — BP 134/90 | HR 97 | Temp 97.6°F | Wt 239.0 lb

## 2018-08-28 DIAGNOSIS — B349 Viral infection, unspecified: Secondary | ICD-10-CM | POA: Diagnosis not present

## 2018-08-28 MED ORDER — NYSTATIN 100000 UNIT/ML MT SUSP: 5.0000 mL | Freq: Four times a day (QID) | OROMUCOSAL | 0 refills | Status: DC

## 2018-08-28 NOTE — Patient Instructions (Signed)
Nystatin: Swish and swallow 4 times a day for 5 days  Keep yourself hydrated  Take DayQuil or NyQuil for cough and aches  For nasal congestion: Use OTC Nasocort or Flonase : 2 nasal sprays on each side of the nose in the morning until you feel better     Call if not gradually better over the next 7 days  Call anytime if the symptoms are severe, you have high fever, short of breath, chest pain

## 2018-08-28 NOTE — Progress Notes (Signed)
Subjective:    Patient ID: Todd Roman, male    DOB: [birth year], 53 y.o.   MRN: [id number]  DOS:  08/28/2018 Type of visit - description: acute  Patient reports symptoms started approximately 07/31/2018. Was seen at this office 08/09/2018, complained of sinus pain, cough, wheezing. Strep test and flu test were negative. Was Rx Augmentin, Hycodan, Flonase and albuterol. He also had some headaches.  He gradually got better, a week ago he felt 80% well.  Symptoms however are gradually coming back with sore throat, cough, nasal congestion and clear nasal discharge. His mouth started to feel "raw" and when he swallows he has anterior neck pain.  Today he also developed mild lightheadedness and achiness. Is taking DayQuil and NyQuil regularly and while he takes those medications aches and pains decrease.  BP Readings from Last 3 Encounters:  08/28/18 134/90  08/09/18 140/90  10/03/17 132/78    Review of Systems  No fever but some chills last night No chest pain, mild shortness of breath sometimes. No nausea, vomiting, diarrhea No recent tick bite Occasional wheezing?  No history of asthma. Occasional clear sputum No further headaches Past Medical History:  Diagnosis Date  . Duodenal ulcer   . GERD (gastroesophageal reflux disease)   . H/O food poisoning    diarrhea, [facility]  . Symptomatic cholelithiasis 10/2014   Dr. Derrell Lollingamirez  . 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 NO PAST SURGERIES      Social History   Socioeconomic History  . Marital status: Married    Spouse name: Not on file  . Number of children: 1  . Years of education: Not on file  . Highest education level: Not on file  Occupational History  . Occupation: Advertising account plannerinsurance agent   Social Needs  . Financial resource strain: Not on file  . Food insecurity:    Worry: Not on file    Inability: Not on file  . Transportation needs:      Medical: Not on file    Non-medical: Not on file  Tobacco Use  . Smoking status: Light Tobacco Smoker  . Smokeless tobacco: Never Used  . Tobacco comment: occ cigar  Substance and Sexual Activity  . Alcohol use: Yes    Alcohol/week: 15.0 standard drinks    Types: 15 Cans of beer per week    Comment: no daily , a beer now and then  . Drug use: No  . Sexual activity: Not on file  Lifestyle  . Physical activity:    Days per week: Not on file    Minutes per session: Not on file  . Stress: Not on file  Relationships  . Social connections:    Talks on phone: Not on file    Gets together: Not on file    Attends religious service: Not on file    Active member of club or organization: Not on file    Attends meetings of clubs or organizations: Not on file    Relationship status: Not on file  . Intimate partner violence:    Fear of current or ex partner: Not on file    Emotionally abused: Not on file    Physically abused: Not on file    Forced sexual activity: Not on file  Other Topics Concern  . Not on file  Social History Narrative   Household- pt , wife, son      Allergies as  of 08/28/2018   No Known Allergies     Medication List       Accurate as of August 28, 2018 11:59 PM. Always use your most recent med list.        albuterol 108 (90 Base) MCG/ACT inhaler Commonly known as:  PROVENTIL HFA;VENTOLIN HFA Inhale 2 puffs into the lungs every 6 (six) hours as needed for wheezing or shortness of breath.   aspirin EC 81 MG tablet Take 81 mg by mouth daily.   fluticasone 50 MCG/ACT nasal spray Commonly known as:  FLONASE Place 2 sprays into both nostrils daily.   HYDROcodone-homatropine 5-1.5 MG/5ML syrup Commonly known as:  HYCODAN Take 5 mLs by mouth every 6 (six) hours as needed.   nystatin 100000 UNIT/ML suspension Commonly known as:  MYCOSTATIN Take 5 mLs (500,000 Units total) by mouth 4 (four) times daily.   PROBIOTIC DAILY Caps 1 po qd            Objective:   Physical Exam BP 134/90   Pulse 97   Temp 97.6 F (36.4 C)   Wt 239 lb (108.4 kg)   SpO2 98%   BMI 33.33 kg/m  General:   Well developed, NAD, BMI noted. HEENT:  Normocephalic . Face symmetric, atraumatic. TMs normal.  Nose is slightly congested, sinuses no TTP. Oral cavity: Throat membranes red, no discharge or white cotton spots.  Tongue, gums normal  Lungs:  CTA B Normal respiratory effort, no intercostal retractions, no accessory muscle use. Heart: RRR,  no murmur.  No pretibial edema bilaterally  Skin: Not pale. Not jaundice Neurologic:  alert & oriented X3.  Speech normal, gait appropriate for age and unassisted Psych--  Cognition and judgment appear intact.  Cooperative with normal attention span and concentration.  Behavior appropriate. No anxious or depressed appearing.      Assessment     Assessment GI: -GERD -Abd pain: GB sludge per US 2016, saw Dr Joaquim Namamirez,no surgery, then was dx w/ a ulcer see below -Mild increase LFTs: Hep A, B and C -2016;  Ultrasound 2016: Fatty liver;  Iron 2018: Normal -H/o  duodenal ulcer by  EGD 04/12/2016 BX no celiac disease, + chronic inflammation no H. pylori, no eosinophilic esophagitis.  No Barrett's H/o migraines (rare sx as of 2019)   Plan:  Viral syndrome: Patient was seen with respiratory symptoms earlier this month, got antibiotics, got better.  Symptoms are coming back. He is well-appearing, vital signs are stable. protracted viral syndrome?  Reinfected with another virus?Marland Kitchen. For now recommend supportive treatment.  He will call if not improving in the next few days.  Will consider do a chest x-ray, CBC and BMP. If he feels much worse he will need a office visit. Thrush?  Developing "mouth soreness" and pain with swallowing at the anterior neck, early thrush?  Will treat empirically with nystatin. See avs

## 2018-08-29 NOTE — Assessment & Plan Note (Signed)
Viral syndrome: Patient was seen with respiratory symptoms earlier this month, got antibiotics, got better.  Symptoms are coming back. He is well-appearing, vital signs are stable. protracted viral syndrome?  Reinfected with another virus?Marland Kitchen For now recommend supportive treatment.  He will call if not improving in the next few days.  Will consider do a chest x-ray, CBC and BMP. If he feels much worse he will need a office visit. Thrush?  Developing "mouth soreness" and pain with swallowing at the anterior neck, early thrush?  Will treat empirically with nystatin. See avs

## 2018-10-25 ENCOUNTER — Encounter: Payer: Self-pay | Admitting: Internal Medicine

## 2020-10-07 ENCOUNTER — Encounter: Payer: Self-pay | Admitting: Internal Medicine

## 2021-06-07 ENCOUNTER — Encounter (HOSPITAL_BASED_OUTPATIENT_CLINIC_OR_DEPARTMENT_OTHER): Payer: Self-pay | Admitting: *Deleted

## 2021-06-07 ENCOUNTER — Other Ambulatory Visit: Payer: Self-pay

## 2021-06-07 ENCOUNTER — Emergency Department (HOSPITAL_BASED_OUTPATIENT_CLINIC_OR_DEPARTMENT_OTHER)
Admission: EM | Admit: 2021-06-07 | Discharge: 2021-06-07 | Disposition: A | Discharge status: Home / Self Care | Payer: BC Managed Care – PPO | Attending: Emergency Medicine | Admitting: Emergency Medicine

## 2021-06-07 DIAGNOSIS — F17200 Nicotine dependence, unspecified, uncomplicated: Secondary | ICD-10-CM | POA: Insufficient documentation

## 2021-06-07 DIAGNOSIS — T782XXA Anaphylactic shock, unspecified, initial encounter: Secondary | ICD-10-CM | POA: Diagnosis not present

## 2021-06-07 DIAGNOSIS — Z7982 Long term (current) use of aspirin: Secondary | ICD-10-CM | POA: Diagnosis not present

## 2021-06-07 MED ORDER — METHYLPREDNISOLONE SODIUM SUCC 125 MG IJ SOLR
INTRAMUSCULAR | Status: AC
  Administered 2021-06-07: 125 mg
  Filled 2021-06-07: qty 2

## 2021-06-07 MED ORDER — PREDNISONE 20 MG PO TABS: 20.0000 mg | ORAL_TABLET | Freq: Every day | ORAL | 0 refills | Status: AC

## 2021-06-07 MED ORDER — EPINEPHRINE 0.3 MG/0.3ML IJ SOAJ: 0.3000 mg | INTRAMUSCULAR | 0 refills | Status: AC | PRN

## 2021-06-07 MED ORDER — CETIRIZINE HCL 10 MG PO TABS: 10.0000 mg | ORAL_TABLET | Freq: Every day | ORAL | 0 refills | Status: AC

## 2021-06-07 MED ORDER — EPINEPHRINE 0.3 MG/0.3ML IJ SOAJ
INTRAMUSCULAR | Status: AC
  Administered 2021-06-07: 0.3 mg
  Filled 2021-06-07: qty 0.3

## 2021-06-07 MED ORDER — FAMOTIDINE IN NACL 20-0.9 MG/50ML-% IV SOLN
INTRAVENOUS | Status: AC
  Administered 2021-06-07: 20 mg
  Filled 2021-06-07: qty 50

## 2021-06-07 MED ORDER — SODIUM CHLORIDE 0.9 % IV BOLUS
1000.0000 mL | Freq: Once | INTRAVENOUS | Status: AC
  Administered 2021-06-07: 1000 mL via INTRAVENOUS

## 2021-06-07 MED ORDER — DIPHENHYDRAMINE HCL 50 MG/ML IJ SOLN
INTRAMUSCULAR | Status: AC
  Administered 2021-06-07: 25 mg
  Filled 2021-06-07: qty 1

## 2021-06-07 NOTE — ED Provider Notes (Signed)
[facility] EMERGENCY DEPARTMENT Provider Note   CSN: [id number] Arrival date & time: 06/07/21  1949     History Chief Complaint  Patient presents with   Allergic Reaction    Todd Roman is a 55 y.o. male.  This is a 55 y.o. male with significant medical history as below, including GERD who presents to the ED with complaint of allergic reaction.  Patient and spouse report patient suddenly developed itching to his hands swelling in his face, rash to arms, difficulty swallowing shortly after eating dinner. Nausea w/o emesis. Normal state of health prior to this.  No known allergens.  No history of anaphylactic reaction in the past.   Level 5 caveat acuity of condition.  The history is provided by the patient and the spouse. No language interpreter was used.  Allergic Reaction Presenting symptoms: difficulty swallowing and rash       Past Medical History:  Diagnosis Date   Duodenal ulcer    GERD (gastroesophageal reflux disease)    H/O food poisoning    diarrhea, [facility]   Symptomatic cholelithiasis 10/2014   Dr. 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   [facility]    Patient Active Problem List   Diagnosis Date Noted   PCP NOTES >>>>>>>>>>>>>>>>>> 10/04/2017   Duodenal ulcer 10/03/2017   Annual physical exam 10/03/2017   GERD (gastroesophageal reflux disease) 05/13/2014   Fatigue 05/13/2014    Past Surgical History:  Procedure Laterality Date   NO PAST SURGERIES         Family History  Problem Relation Age of Onset   Stroke Mother 84       M and GF   Alcohol abuse Mother    Heart failure Mother    Alcohol abuse Maternal Aunt    CAD Neg Hx    Colon cancer Neg Hx    Prostate cancer Neg Hx     Social History   Tobacco Use   Smoking status: Light Smoker   Smokeless tobacco: Never   Tobacco comments:    occ cigar  Substance Use Topics   Alcohol use: Yes    Alcohol/week: 15.0 standard drinks    Types: 15 Cans of beer  per week    Comment: no daily , a beer now and then   Drug use: No    Home Medications Prior to Admission medications   Medication Sig Start Date End Date Taking? Authorizing Provider  cetirizine (ZYRTEC ALLERGY) 10 MG tablet Take 1 tablet (10 mg total) by mouth daily. 06/07/21  Yes Tanda Rockers A, DO  EPINEPHrine 0.3 mg/0.3 mL IJ SOAJ injection Inject 0.3 mg into the muscle as needed for anaphylaxis. 06/07/21  Yes Tanda Rockers A, DO  predniSONE (DELTASONE) 20 MG tablet Take 1 tablet (20 mg total) by mouth daily for 5 days. 06/07/21 06/12/21 Yes Tanda Rockers A, DO  albuterol (PROVENTIL HFA;VENTOLIN HFA) 108 (90 Base) MCG/ACT inhaler Inhale 2 puffs into the lungs every 6 (six) hours as needed for wheezing or shortness of breath. Patient not taking: Reported on 08/28/2018 08/09/18   Saguier, Ramon Dredge, PA-C  aspirin EC 81 MG tablet Take 81 mg by mouth daily.    [provider]  fluticasone (FLONASE) 50 MCG/ACT nasal spray Place 2 sprays into both nostrils daily. 08/09/18   Saguier, Ramon Dredge, PA-C  HYDROcodone-homatropine (HYCODAN) 5-1.5 MG/5ML syrup Take 5 mLs by mouth every 6 (six) hours as needed. 08/09/18   Saguier, Ramon Dredge, PA-C  nystatin (MYCOSTATIN) 100000 UNIT/ML suspension  Take 5 mLs (500,000 Units total) by mouth 4 (four) times daily. 08/28/18   Wanda Plump, MD  Probiotic Product (PROBIOTIC DAILY) CAPS 1 po qd 10/20/14   Donato Schultz, DO    Allergies    Patient has no known allergies.  Review of Systems   Review of Systems  Unable to perform ROS: Acuity of condition  HENT:  Positive for sore throat and trouble swallowing.   Respiratory:  Positive for shortness of breath.   Gastrointestinal:  Positive for nausea.  Skin:  Positive for rash.   Physical Exam Updated Vital Signs BP (!) 146/101   Pulse 97   Temp 98.6 F (37 C)   Resp 14   Ht 5\' 11"  (1.803 m)   Wt 108 kg   SpO2 94%   BMI 33.19 kg/m   Physical Exam Vitals and nursing note reviewed.  Constitutional:       General: He is in acute distress.     Appearance: He is well-developed.  HENT:     Head: Normocephalic and atraumatic.     Comments: Facial swelling, coughing, difficulty clearing secretions.  No stridor.  No Trismus.    Right Ear: External ear normal.     Left Ear: External ear normal.     Mouth/Throat:     Mouth: Mucous membranes are moist.  Eyes:     General: No scleral icterus. Cardiovascular:     Rate and Rhythm: Normal rate and regular rhythm.     Pulses: Normal pulses.     Heart sounds: Normal heart sounds.  Pulmonary:     Effort: Pulmonary effort is normal. No accessory muscle usage or respiratory distress.     Breath sounds: Normal breath sounds. No decreased breath sounds.  Abdominal:     General: Abdomen is flat.     Palpations: Abdomen is soft.     Tenderness: There is no abdominal tenderness.  Musculoskeletal:        General: Normal range of motion.     Cervical back: Normal range of motion.     Right lower leg: No edema.     Left lower leg: No edema.  Skin:    General: Skin is warm and dry.     Capillary Refill: Capillary refill takes less than 2 seconds.     Comments: Urticarial rash to hands / forearms b/l  Neurological:     Mental Status: He is alert and oriented to person, place, and time.  Psychiatric:        Mood and Affect: Mood normal.        Behavior: Behavior normal.    ED Results / Procedures / Treatments   Labs (all labs ordered are listed, but only abnormal results are displayed) Labs Reviewed - No data to display  EKG None  Radiology No results found.  Procedures .Critical Care Performed by: , DO Authorized by: Sloan Leiter, DO   Critical care provider statement:    Critical care time (minutes):  38   Critical care time was exclusive of:  Separately billable procedures and treating other patients   Critical care was necessary to treat or prevent imminent or life-threatening deterioration of the following  conditions: anaphylaxis.   Critical care was time spent personally by me on the following activities:  Ordering and performing treatments and interventions, ordering and review of laboratory studies, ordering and review of radiographic studies, pulse oximetry, re-evaluation of patient's condition, review of old charts, obtaining history  from patient or surrogate, examination of patient, evaluation of patient's response to treatment and development of treatment plan with patient or surrogate   Medications Ordered in ED Medications  methylPREDNISolone sodium succinate (SOLU-MEDROL) 125 mg/2 mL injection (125 mg  Given 06/07/21 1959)  diphenhydrAMINE (BENADRYL) 50 MG/ML injection (25 mg  Given 06/07/21 1959)  EPINEPHrine (EPI-PEN) 0.3 mg/0.3 mL injection (0.3 mg  Given 06/07/21 1958)  famotidine (PEPCID) 20-0.9 MG/50ML-% IVPB (0 mg  Stopped 06/07/21 2035)  sodium chloride 0.9 % bolus 1,000 mL (0 mLs Intravenous Stopped 06/07/21 2140)    ED Course  I have reviewed the triage vital signs and the nursing notes.  Pertinent labs & imaging results that were available during my care of the patient were reviewed by me and considered in my medical decision making (see chart for details).    MDM Rules/Calculators/A&P                          CC: anaphylaxis  This patient complains of above; this involves an extensive number of treatment options and is a complaint that carries with it a high risk of complications and morbidity. Vital signs were reviewed. Serious etiologies considered.  Patient with anaphylactic reaction, will give epinephrine, steroids, Benadryl, Pepcid.  Fluids. Close airway monitoring.  Record review:  Previous records obtained and reviewed   Additional history obtained from spouse  Management: Given steroids, pepcid, benadryl, epipen, IVF  Reassessment:  Pt reports overall feeling much better. Tolerating PO. Breathing normally on room air. Swelling has subsided. No dysphagia  reported. Pt observed in the ED for >3 hours following epipen administration, no reoccurrence of symptoms.   Discussed supportive care Epi pen use Anti-histamine/steroids for home O/p f/u with allergy  The patient improved significantly and was discharged in stable condition. Detailed discussions were had with the patient regarding current findings, and need for close f/u with PCP or on call doctor. The patient has been instructed to return immediately if the symptoms worsen in any way for re-evaluation. Patient verbalized understanding and is in agreement with current care plan. All questions answered prior to discharge.           This chart was dictated using voice recognition software.  Despite best efforts to proofread,  errors can occur which can change the documentation meaning.   Final Clinical Impression(s) / ED Diagnoses Final diagnoses:  Anaphylaxis, initial encounter    Rx / DC Orders ED Discharge Orders          Ordered    EPINEPHrine 0.3 mg/0.3 mL IJ SOAJ injection  As needed        06/07/21 2255    cetirizine (ZYRTEC ALLERGY) 10 MG tablet  Daily        06/07/21 2255    predniSONE (DELTASONE) 20 MG tablet  Daily        06/07/21 2255             Sloan Leiter, DO 06/07/21 2258

## 2021-06-07 NOTE — ED Triage Notes (Addendum)
C/o sudden allergic reaction after 17 mins after eating dinner , pt clearing throat, lips swelling, reports hand itching, throat itching and tongue feeling thick., pt taking to rm 3 , MD to bedside

## 2021-06-07 NOTE — ED Notes (Signed)
Discharge instructions discussed with pt and s/o at bedside. They were able to verbalize understanding of instruction, prescription, and follow up care.

## 2021-06-10 ENCOUNTER — Encounter: Payer: Self-pay | Admitting: Internal Medicine

## 2021-06-10 ENCOUNTER — Other Ambulatory Visit: Payer: Self-pay

## 2021-06-10 ENCOUNTER — Ambulatory Visit (INDEPENDENT_AMBULATORY_CARE_PROVIDER_SITE_OTHER): Payer: BC Managed Care – PPO | Admitting: Internal Medicine

## 2021-06-10 VITALS — BP 126/84 | HR 91 | Temp 98.0°F | Resp 16 | Ht 71.0 in | Wt 245.1 lb

## 2021-06-10 DIAGNOSIS — T782XXD Anaphylactic shock, unspecified, subsequent encounter: Secondary | ICD-10-CM

## 2021-06-10 NOTE — Patient Instructions (Addendum)
Please schedule a complete physical exam at your earliest convenience.   Continue cetirizine daily  Pepcid OTC: 20 mg BID   Keep EpiPen with you.  If you have another reaction: Start EpiPen, take OTC Benadryl 25 mg to 50 mg go to the ER.

## 2021-06-10 NOTE — Progress Notes (Signed)
Subjective:    Patient ID: Todd Roman, male    DOB: 1966/04/10, 55 y.o.   MRN: [id number]  DOS:  06/10/2021 Type of visit - description: ER follow-up  Went to the ER 06/07/2021 with sudden development of itching of his eyes - palm hands. Also developed face /chest/ throat/esophagus swelling, difficulty swallowing.   ` All symptoms a started immediately after he finish supper. He has looked back and he has not eating anything unusual or being exposed to anything new. Symptoms have not resurfaced.  Review of Systems See above   Past Medical History:  Diagnosis Date   Duodenal ulcer    GERD (gastroesophageal reflux disease)    H/O food poisoning    diarrhea, [facility]   Symptomatic cholelithiasis 10/2014   Dr. 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 If you have any questions, ask your nurse or doctor.          albuterol 108 (90 Base) MCG/ACT inhaler Commonly known as: VENTOLIN HFA Inhale 2 puffs into the lungs every 6 (six) hours as needed for wheezing or shortness of breath.   aspirin EC 81 MG tablet Take 81 mg by mouth daily.   cetirizine 10 MG tablet Commonly known as: ZyrTEC Allergy Take 1 tablet (10 mg total) by mouth daily.   EPINEPHrine 0.3 mg/0.3 mL Soaj injection Commonly known as: EPI-PEN Inject 0.3 mg into the muscle as needed for anaphylaxis.   fluticasone 50 MCG/ACT nasal spray Commonly known as: FLONASE Place 2 sprays into both nostrils daily.   HYDROcodone-homatropine 5-1.5 MG/5ML syrup Commonly known as: HYCODAN Take 5 mLs by mouth every 6 (six) hours as needed.   nystatin 100000 UNIT/ML suspension Commonly known as: MYCOSTATIN Take 5 mLs (500,000 Units total) by mouth 4 (four) times daily.    predniSONE 20 MG tablet Commonly known as: DELTASONE Take 1 tablet (20 mg total) by mouth daily for 5 days.   Probiotic Daily Caps 1 po qd           Objective:   Physical Exam BP 126/84 (BP Location: Left Arm, Patient Position: Sitting, Cuff Size: Normal)   Pulse 91   Temp 98 F (36.7 C) (Oral)   Resp 16   Ht 5\' 11"  (1.803 m)   Wt 245 lb 2 oz (111.2 kg)   SpO2 96%   BMI 34.19 kg/m  General:   Well developed, NAD, BMI noted. HEENT:  Normocephalic . Face symmetric, atraumatic.  Lips and tongue normal Lungs:  CTA B Normal respiratory effort, no intercostal retractions, no accessory muscle use. Heart: RRR,  no murmur.  Lower extremities: no pretibial edema bilaterally  Skin: Not pale. Not jaundice Neurologic:  alert & oriented X3.  Speech normal, gait appropriate for age and unassisted Psych--  Cognition and judgment appear intact.  Cooperative with normal attention span and concentration.  Behavior appropriate. No anxious or depressed appearing.      Assessment    Assessment GI: -GERD -Abd pain: GB sludge per 2016, saw Dr 2017 surgery, then was dx w/ a ulcer see below -Mild increase LFTs: Hep A, B and C -2016;  Ultrasound 2016: Fatty liver;  Iron 2018:  Normal -H/o  duodenal ulcer by  EGD 04/12/2016 BX no celiac disease, + chronic inflammation no H. pylori, no eosinophilic esophagitis.  No Barrett's H/o migraines (rare sx as of 2019)   Plan:  Severe allergic reaction: As described above, symptoms started immediately after a meal.  Was treated at the ER appropriately, since then he has no more symptoms.  Still on prednisone, has an EpiPen on hand and is taking antihistaminics. Plan: Finish prednisone (likes to decrease the dose to 10 mg a day) Continue cetirizine, add Pepcid. Keep EpiPen, see AVS Has already an appointment to see the allergist. RTC at his convenience for CPX    This visit occurred during the SARS-CoV-2 public health emergency.   Safety protocols were in place, including screening questions prior to the visit, additional usage of staff PPE, and extensive cleaning of exam room while observing appropriate contact time as indicated for disinfecting solutions.

## 2021-06-12 NOTE — Assessment & Plan Note (Signed)
Assessment GI: -GERD -Abd pain: GB sludge per [place] 2016, saw Dr Joaquim Nam surgery, then was dx w/ a ulcer see below -Mild increase LFTs: Hep A, B and C -2016;  Ultrasound 2016: Fatty liver;  Iron 2018: Normal -H/o  duodenal ulcer by  EGD 04/12/2016 BX no celiac disease, + chronic inflammation no H. pylori, no eosinophilic esophagitis.  No Barrett's H/o migraines (rare sx as of 2019)   Plan:  Severe allergic reaction: As described above, symptoms started immediately after a meal.  Was treated at the ER appropriately, since then he has no more symptoms.  Still on prednisone, has an EpiPen on hand and is taking antihistaminics. Plan: Finish prednisone (likes to decrease the dose to 10 mg a day) Continue cetirizine, add Pepcid. Keep EpiPen, see AVS Has already an appointment to see the allergist. RTC at his convenience for CPX

## 2021-07-08 DIAGNOSIS — Z299 Encounter for prophylactic measures, unspecified: Secondary | ICD-10-CM | POA: Diagnosis not present

## 2021-07-08 DIAGNOSIS — T783XXD Angioneurotic edema, subsequent encounter: Secondary | ICD-10-CM | POA: Diagnosis not present

## 2021-07-08 DIAGNOSIS — H1045 Other chronic allergic conjunctivitis: Secondary | ICD-10-CM | POA: Diagnosis not present

## 2021-07-08 DIAGNOSIS — J3089 Other allergic rhinitis: Secondary | ICD-10-CM | POA: Diagnosis not present

## 2021-09-23 ENCOUNTER — Encounter: Payer: Self-pay | Admitting: Internal Medicine

## 2022-03-30 ENCOUNTER — Encounter: Payer: Self-pay | Admitting: Internal Medicine

## 2022-11-10 ENCOUNTER — Encounter: Payer: Self-pay | Admitting: Internal Medicine

## 2024-02-27 ENCOUNTER — Encounter: Payer: Self-pay | Admitting: Internal Medicine
